# Patient Record
Sex: Female | Born: 1975 | Race: White | Hispanic: No | Marital: Married | Smoking: Never smoker
Health system: Southern US, Community
[De-identification: ages and names within clinical notes are randomized; demographics above are authoritative.]

## PROBLEM LIST (undated history)

## (undated) DIAGNOSIS — G43909 Migraine, unspecified, not intractable, without status migrainosus: Secondary | ICD-10-CM

## (undated) DIAGNOSIS — K219 Gastro-esophageal reflux disease without esophagitis: Secondary | ICD-10-CM

## (undated) DIAGNOSIS — R000 Tachycardia, unspecified: Secondary | ICD-10-CM

## (undated) DIAGNOSIS — N946 Dysmenorrhea, unspecified: Secondary | ICD-10-CM

## (undated) DIAGNOSIS — G901 Familial dysautonomia [Riley-Day]: Secondary | ICD-10-CM

## (undated) DIAGNOSIS — E0500 Thyrotoxicosis with diffuse goiter without thyrotoxic crisis or storm: Secondary | ICD-10-CM

## (undated) DIAGNOSIS — I4711 Inappropriate sinus tachycardia, so stated: Secondary | ICD-10-CM

## (undated) HISTORY — PX: OTHER SURGICAL HISTORY: SHX169

## (undated) HISTORY — DX: Thyrotoxicosis with diffuse goiter without thyrotoxic crisis or storm: E05.00

## (undated) HISTORY — DX: Dysmenorrhea, unspecified: N94.6

## (undated) HISTORY — DX: Migraine, unspecified, not intractable, without status migrainosus: G43.909

## (undated) HISTORY — DX: Gastro-esophageal reflux disease without esophagitis: K21.9

---

## 2001-09-02 ENCOUNTER — Encounter: Payer: Self-pay | Admitting: Internal Medicine

## 2007-03-26 ENCOUNTER — Encounter: Payer: Self-pay | Admitting: Internal Medicine

## 2009-01-12 ENCOUNTER — Encounter: Payer: Self-pay | Admitting: Internal Medicine

## 2009-01-26 ENCOUNTER — Encounter: Payer: Self-pay | Admitting: Internal Medicine

## 2009-02-09 ENCOUNTER — Encounter: Payer: Self-pay | Admitting: Internal Medicine

## 2009-03-10 ENCOUNTER — Ambulatory Visit: Payer: Self-pay | Admitting: Radiology

## 2009-03-10 ENCOUNTER — Emergency Department (HOSPITAL_BASED_OUTPATIENT_CLINIC_OR_DEPARTMENT_OTHER): Admission: EM | Admit: 2009-03-10 | Discharge: 2009-03-10 | Payer: Self-pay | Admitting: Emergency Medicine

## 2009-03-18 ENCOUNTER — Ambulatory Visit: Payer: Self-pay | Admitting: Internal Medicine

## 2009-03-18 DIAGNOSIS — E890 Postprocedural hypothyroidism: Secondary | ICD-10-CM

## 2009-03-18 DIAGNOSIS — R002 Palpitations: Secondary | ICD-10-CM

## 2009-03-18 DIAGNOSIS — E0500 Thyrotoxicosis with diffuse goiter without thyrotoxic crisis or storm: Secondary | ICD-10-CM

## 2009-04-06 ENCOUNTER — Encounter: Payer: Self-pay | Admitting: Internal Medicine

## 2009-04-26 ENCOUNTER — Encounter: Payer: Self-pay | Admitting: Internal Medicine

## 2009-11-11 ENCOUNTER — Ambulatory Visit: Payer: Self-pay | Admitting: Internal Medicine

## 2009-11-11 DIAGNOSIS — R079 Chest pain, unspecified: Secondary | ICD-10-CM

## 2009-11-11 DIAGNOSIS — I959 Hypotension, unspecified: Secondary | ICD-10-CM

## 2010-07-19 NOTE — Assessment & Plan Note (Signed)
Summary: ROV/ GD   Visit Type:  Follow-up Primary Provider:  Lawerance Bach  CC:  no complaints.  History of Present Illness: [name] is seen in followup for palpitations associated with sinus rhythm and treated hypothyroidism. She was started on Inderal after her last visit and this has had a markedly beneficial effect on her palpitations. It has been associated with borderline low blood pressure which is been complicated by some dizziness. This is worse somewhat in the heat.  Records also has been intercurrently interrupted by severe right-sided flank pain for which he underwent extensive evaluations resulting in no specific diagnosis and then spontaneous resolution. She also had chest pains with this which are persistent and then self resolving  Current Medications (verified): 1)  Synthroid 112 Mcg Tabs (Levothyroxine Sodium) .... Take One Tablet Once Daily 2)  Loestrin Fe 1/20 1-20 Mg-Mcg Tabs (Norethin Ace-Eth Estrad-Fe) .... Take As Directed 3)  Multivitamins  Tabs (Multiple Vitamin) .... Once Daily 4)  Calcium Citrate 200 Mg Tabs (Calcium Citrate) .... Once Daily 5)  Inderal La 60 Mg Xr24h-Cap (Propranolol Hcl) .... Take One Tablet Daily  Allergies (verified): 1)  ! Sulfa  Past History:  Past Medical History: Last updated: 03/18/2009 Migraine headaches Dysmenorrhea History of Graves' disease GE reflux disease  Past Surgical History: Last updated: 03/18/2009 Sinus surgery Thyroidectomy  Family History: Last updated: 03/18/2009 Siblings:coarctation of the aorta and a sister Negative FH of Diabetes, Hypertension, or Coronary Artery Disease  Social History: Last updated: 03/18/2009 Married  Tobacco Use - No.  Alcohol Use - no  Vital Signs:  Patient profile:   35 year old female Height:      70 inches Weight:      161 pounds BMI:     23.18 Pulse rate:   65 / minute Resp:     16 per minute BP sitting:   94 / 66  (right arm)  Vitals Entered By: Marrion Coy, CNA  (Nov 11, 2009 3:40 PM)  Physical Exam  General:  The patient was alert and oriented in no acute distress. HEENT Normal.  Neck veins were flat, carotids were brisk.  Lungs were clear.  Heart sounds were regular without murmurs or gallops.  Abdomen was soft with active bowel sounds. There is no clubbing cyanosis or edema. Skin Warm and dry neuro grossly normal   EKG  Procedure date:  11/11/2009  Findings:      sinus rhythm at 65 Intervals 0.16/110/0.42 Axis is -15 R. prime in lead V2  Impression & Recommendations:  Problem # 1:  PALPITATIONS, RECURRENT (ICD-785.1) her palpitations are much improved on low dose Inderal. This is been complicated by some modest degree of hypotension with orthostatic intolerance. She is encouraged to increase her fluid intake as well as to consider salt supplementation. She is advised these are available (has advised [place] ) at [facility].  we also discussed important aggravating factors of the heat Her updated medication list for this problem includes:    Inderal La 60 Mg Xr24h-cap (Propranolol hcl) .Marland Kitchen... Take one tablet daily  Problem # 2:  HYPOTENSION, UNSPECIFIED (ICD-458.9) please see the above  Problem # 3:  CHEST PAIN (ICD-786.50) noncardiac chest pain. She has siblings that have had a coarctation. She has no known history of aortic valve disease or aortic disease by a series of episodes over the last 15 years. Her updated medication list for this problem includes:    Inderal La 60 Mg Xr24h-cap (Propranolol hcl) .Marland Kitchen... Take one tablet daily  Patient  Instructions: 1)  Your physician wants you to follow-up in: 12 months with Dr Graciela Husbands.  You will receive a reminder letter in the mail two months in advance. If you don't receive a letter, please call our office to schedule the follow-up appointment. Prescriptions: INDERAL LA 60 MG XR24H-CAP (PROPRANOLOL HCL) Take one tablet daily  #90 x 3   Entered by:   Optometrist BSN   Authorized by:   Nathen May, MD, [facility]   Signed by:   Gypsy Balsam RN BSN on 11/11/2009   Method used:   Electronically to        [facility]* (mail-order)       [address] [place], [place]  [address]       Ph: [phone]       Fax: [phone]   RxID:   [id number]

## 2010-09-23 LAB — DIFFERENTIAL
Basophils Absolute: 0 K/uL (ref 0.0–0.1)
Basophils Relative: 1 % (ref 0–1)
Eosinophils Absolute: 0.1 K/uL (ref 0.0–0.7)
Eosinophils Relative: 3 % (ref 0–5)
Lymphocytes Relative: 54 % — ABNORMAL HIGH (ref 12–46)
Lymphs Abs: 2.8 K/uL (ref 0.7–4.0)
Monocytes Absolute: 0.5 10*3/uL (ref 0.1–1.0)
Monocytes Relative: 11 % (ref 3–12)
Neutro Abs: 1.7 10*3/uL (ref 1.7–7.7)
Neutrophils Relative %: 33 % — ABNORMAL LOW (ref 43–77)

## 2010-09-23 LAB — BASIC METABOLIC PANEL
CO2: 26 mEq/L (ref 19–32)
Calcium: 9.7 mg/dL (ref 8.4–10.5)
Chloride: 105 mEq/L (ref 96–112)
GFR calc Af Amer: >60 mL/min (ref >60)
Glucose, Bld: 98 mg/dL (ref 70–99)
Potassium: 3.5 mEq/L (ref 3.5–5.1)
Sodium: 142 mEq/L (ref 135–145)

## 2010-09-23 LAB — CBC
HCT: 44.5 % (ref 36.0–46.0)
Hemoglobin: 15.3 g/dL — ABNORMAL HIGH (ref 12.0–15.0)
MCHC: 34.5 g/dL (ref 30.0–36.0)
MCV: 89.9 fL (ref 78.0–100.0)
Platelets: 189 K/uL (ref 150–400)
RBC: 4.95 MIL/uL (ref 3.87–5.11)
RDW: 12 % (ref 11.5–15.5)
WBC: 5.1 K/uL (ref 4.0–10.5)

## 2010-09-23 LAB — TSH: TSH: 1.576 u[IU]/mL (ref 0.350–4.500)

## 2010-09-23 LAB — BASIC METABOLIC PANEL WITH GFR
BUN: 10 mg/dL (ref 6–23)
Creatinine, Ser: 0.8 mg/dL (ref 0.4–1.2)
GFR calc non Af Amer: >60 mL/min (ref >60)

## 2010-09-23 LAB — PREGNANCY, URINE: Preg Test, Ur: NEGATIVE

## 2010-09-23 LAB — MAGNESIUM: Magnesium: 1.9 mg/dL (ref 1.5–2.5)

## 2010-09-23 LAB — T4, FREE: Free T4: 1.86 ng/dL — ABNORMAL HIGH (ref 0.80–1.80)

## 2010-09-23 LAB — D-DIMER, QUANTITATIVE: D-Dimer, Quant: 0.76 ug/mL-FEU — ABNORMAL HIGH (ref 0.00–0.48)

## 2010-11-11 ENCOUNTER — Other Ambulatory Visit: Payer: Self-pay | Admitting: *Deleted

## 2010-11-11 MED ORDER — PROPRANOLOL HCL ER 60 MG PO CP24: 60.0000 mg | ORAL_CAPSULE | Freq: Every day | ORAL | Status: DC

## 2010-11-23 ENCOUNTER — Encounter: Payer: Self-pay | Admitting: Internal Medicine

## 2010-12-27 ENCOUNTER — Ambulatory Visit: Payer: Self-pay | Admitting: Internal Medicine

## 2010-12-29 ENCOUNTER — Encounter: Payer: Self-pay | Admitting: Internal Medicine

## 2010-12-29 ENCOUNTER — Ambulatory Visit (INDEPENDENT_AMBULATORY_CARE_PROVIDER_SITE_OTHER): Payer: BC Managed Care – PPO | Admitting: Internal Medicine

## 2010-12-29 VITALS — BP 114/68 | HR 58 | Ht 66.0 in | Wt 167.0 lb

## 2010-12-29 DIAGNOSIS — R002 Palpitations: Secondary | ICD-10-CM

## 2010-12-29 NOTE — Patient Instructions (Signed)
Your physician wants you to follow-up in: 1 year with Dr Klein.  You will receive a reminder letter in the mail two months in advance. If you don't receive a letter, please call our office to schedule the follow-up appointment.  

## 2010-12-29 NOTE — Assessment & Plan Note (Signed)
Well-controlled on Inderal. Given the ongoing stresses in life with her daughter with cancer etc., will leave the beta blocker on for now

## 2010-12-29 NOTE — Progress Notes (Signed)
  HPI  Emily Richardson is a 35 y.o. female Seen in followup of palpitations  She has done exceedingly well on low dose inderal  Her last year has been incredibly stressful at home and she was passed her breaking point;  She wsa  diagnosed with "adrenal Insufficiency" by a chiropracter and treated with adrenal stimulants for two weeks and got better.    Past Medical History  Diagnosis Date  . Migraine headache   . Dysmenorrhea   . Grave's disease   . GERD (gastroesophageal reflux disease)     Past Surgical History  Procedure Date  . Thryroidectomy   . Sinus surgery (other)     Current Outpatient Prescriptions  Medication Sig Dispense Refill  . Calcium Citrate 200 MG TABS Take 3 tablets by mouth daily.       . fish oil-omega-3 fatty acids 1000 MG capsule Take 1 g by mouth daily.        . Flaxseed, Linseed, (FLAX SEED OIL PO) Take by mouth daily.        Marland Kitchen gabapentin (NEURONTIN) 300 MG capsule Take 300 mg by mouth daily.        Marland Kitchen levothyroxine (SYNTHROID) 112 MCG tablet Take 112 mcg by mouth daily.        Marland Kitchen Lysine HCl 500 MG TABS Take 1 tablet by mouth every hemodialysis.        Marland Kitchen MAGNESIUM PO Take by mouth daily.        . Multiple Vitamins-Minerals (MULTIVITAL) tablet Take 1 tablet by mouth daily.        . norethindrone-ethinyl estradiol (LOESTRIN FE 1/20) 1-20 MG-MCG per tablet Take 1 tablet by mouth as directed.        . propranolol (INDERAL LA) 60 MG 24 hr capsule Take 1 capsule (60 mg total) by mouth daily. Pt needs office visit with Dr. Graciela Husbands  90 capsule  0    Allergies  Allergen Reactions  . Sulfonamide Derivatives     Review of Systems negative except from HPI and PMH  Physical Exam Well developed and well nourished in no acute distress JVP flat; carotids brisk and full Clear to ausculation Regular rate and rhythm, no murmurs gallops or rub Soft with active bowel sounds No clubbing cyanosis and edema Alert and oriented, grossly normal motor and sensory function Skin  Warm and Dry  ECGSinus rhythm with low volts at 58 Intervals 0.13/0.10/0.41 RSR prime Assessment and  Plan

## 2011-03-31 ENCOUNTER — Emergency Department (HOSPITAL_BASED_OUTPATIENT_CLINIC_OR_DEPARTMENT_OTHER)
Admission: EM | Admit: 2011-03-31 | Discharge: 2011-03-31 | Disposition: A | Discharge status: Home / Self Care | Payer: BC Managed Care – PPO | Attending: Emergency Medicine | Admitting: Emergency Medicine

## 2011-03-31 ENCOUNTER — Encounter (HOSPITAL_BASED_OUTPATIENT_CLINIC_OR_DEPARTMENT_OTHER): Payer: Self-pay | Admitting: Emergency Medicine

## 2011-03-31 ENCOUNTER — Emergency Department (INDEPENDENT_AMBULATORY_CARE_PROVIDER_SITE_OTHER): Payer: BC Managed Care – PPO

## 2011-03-31 ENCOUNTER — Other Ambulatory Visit: Payer: Self-pay

## 2011-03-31 DIAGNOSIS — R079 Chest pain, unspecified: Secondary | ICD-10-CM

## 2011-03-31 DIAGNOSIS — Z79899 Other long term (current) drug therapy: Secondary | ICD-10-CM | POA: Insufficient documentation

## 2011-03-31 DIAGNOSIS — K219 Gastro-esophageal reflux disease without esophagitis: Secondary | ICD-10-CM | POA: Insufficient documentation

## 2011-03-31 HISTORY — DX: Inappropriate sinus tachycardia, so stated: I47.11

## 2011-03-31 HISTORY — DX: Familial dysautonomia (riley-day): G90.1

## 2011-03-31 HISTORY — DX: Tachycardia, unspecified: R00.0

## 2011-03-31 LAB — DIFFERENTIAL
Basophils Absolute: 0 K/uL (ref 0.0–0.1)
Basophils Relative: 0 % (ref 0–1)
Eosinophils Absolute: 0.2 K/uL (ref 0.0–0.7)
Eosinophils Relative: 2 % (ref 0–5)
Lymphocytes Relative: 26 % (ref 12–46)
Lymphs Abs: 2 K/uL (ref 0.7–4.0)
Monocytes Absolute: 0.6 K/uL (ref 0.1–1.0)
Monocytes Relative: 8 % (ref 3–12)
Neutro Abs: 4.9 K/uL (ref 1.7–7.7)
Neutrophils Relative %: 64 % (ref 43–77)

## 2011-03-31 LAB — CBC
Hemoglobin: 15.5 g/dL — ABNORMAL HIGH (ref 12.0–15.0)
MCV: 88.2 fL (ref 78.0–100.0)
Platelets: 190 10*3/uL (ref 150–400)
RBC: 5.08 MIL/uL (ref 3.87–5.11)
WBC: 7.7 10*3/uL (ref 4.0–10.5)

## 2011-03-31 LAB — BASIC METABOLIC PANEL
CO2: 27 mEq/L (ref 19–32)
Chloride: 102 mEq/L (ref 96–112)
Sodium: 140 mEq/L (ref 135–145)

## 2011-03-31 LAB — TROPONIN I: Troponin I: <0.3 ng/mL (ref <0.30)

## 2011-03-31 MED ORDER — ASPIRIN 325 MG PO TABS
325.0000 mg | ORAL_TABLET | Freq: Once | ORAL | Status: AC
  Administered 2011-03-31: 325 mg via ORAL
  Filled 2011-03-31: qty 1

## 2011-03-31 NOTE — ED Notes (Signed)
Pt reports midsternal CP since Wednesday that radiates straight through into the back. C/o diaphoresis tonight. Denies SOB or nausea.

## 2011-03-31 NOTE — ED Provider Notes (Addendum)
History     CSN: [id number] Arrival date & time: 03/31/2011  3:31 AM  Chief Complaint  Patient presents with  . Chest Pain    (Consider location/radiation/quality/duration/timing/severity/associated sxs/prior treatment) Patient is a 35 y.o. female presenting with chest pain. The history is provided by the patient. No language interpreter was used.  Chest Pain The chest pain began 2 days ago. Chest pain occurs frequently. The chest pain is worsening. At its most intense, the pain is at 10/10. The pain is currently at 3/10. The severity of the pain is severe. The quality of the pain is described as squeezing. The pain radiates to the mid back. Chest pain is worsened by certain positions. Pertinent negatives for primary symptoms include no fever, no shortness of breath, no cough, no palpitations, no abdominal pain, no nausea and no vomiting.  Associated symptoms include diaphoresis. She tried antacids for the symptoms.  Her past medical history is significant for arrhythmia and thyroid problem.  Her family medical history is significant for CAD in family and early MI in family.    Chest Pain  The current episode started 2 days ago. The problem occurs frequently. The problem has been worsening. The pain is severe. The quality of the pain is described as squeezing. The pain radiates to the mid back. Associated symptoms include diaphoresis. Pertinent negatives include no abdominal pain, cough, fever, nausea, palpitations, shortness of breath or vomiting. She has tried antacids for the symptoms.  Her past medical history is significant for arrhythmia and thyroid problem.  Her family medical history is significant for CAD in family and early MI in family.   Patient has had several days of substernal chest pain radiating through to her back. Patient has history of having tachycardia and thyroid problems. She says she came in tonight issues getting cold sweats that she felt that her symptoms are  getting worse. Patient does have early family history of MI with family members having heart attacks before the age of 60. She has no family history of PE or DVT and the patient has been evaluated for this twice previously during other episodes of chest pain with no findings to show thromboembolic disease. Past Medical History  Diagnosis Date  . Migraine headache   . Dysmenorrhea   . Grave's disease   . GERD (gastroesophageal reflux disease)   . Grave's disease   . Inappropriate sinus tachycardia   . Dysautonomia     Past Surgical History  Procedure Date  . Thryroidectomy   . Sinus surgery (other)     History reviewed. No pertinent family history.  History  Substance Use Topics  . Smoking status: Never Smoker   . Smokeless tobacco: Not on file  . Alcohol Use: Yes     socially    OB History    Grav Para Term Preterm Abortions TAB SAB Ect Mult Living                  Review of Systems  Constitutional: Positive for diaphoresis. Negative for fever.  Respiratory: Negative for cough and shortness of breath.   Cardiovascular: Positive for chest pain. Negative for palpitations.  Gastrointestinal: Negative for nausea, vomiting and abdominal pain.    Allergies  Sulfonamide derivatives  Home Medications   Current Outpatient Rx  Name Route Sig Dispense Refill  . CALCIUM CITRATE 200 MG PO TABS Oral Take 3 tablets by mouth daily.     . OMEGA-3 FATTY ACIDS 1000 MG PO CAPS Oral Take 1 g  by mouth daily.      Marland Kitchen FLAX SEED OIL PO Oral Take by mouth daily.      Marland Kitchen LEVOTHYROXINE SODIUM 112 MCG PO TABS Oral Take 112 mcg by mouth daily.      Marland Kitchen LYSINE HCL 500 MG PO TABS Oral Take 1 tablet by mouth every hemodialysis.      Marland Kitchen MAGNESIUM PO Oral Take by mouth daily.      . MULTIVITAL PO TABS Oral Take 1 tablet by mouth daily.      Azzie Roup ACE-ETH ESTRAD-FE 1-20 MG-MCG PO TABS Oral Take 1 tablet by mouth as directed.      Marland Kitchen PROPRANOLOL HCL ER 60 MG PO CP24 Oral Take 1 capsule (60 mg  total) by mouth daily. Pt needs office visit with Dr. Graciela Husbands 90 capsule 0  . GABAPENTIN 300 MG PO CAPS Oral Take 300 mg by mouth daily.        BP 128/85  Pulse 81  Temp(Src) 98.4 F (36.9 C) (Oral)  Resp 18  Ht 5\' 10"  (1.778 m)  Wt 165 lb (74.844 kg)  BMI 23.68 kg/m2  SpO2 100%  Physical Exam  Nursing note and vitals reviewed. Constitutional: She is oriented to person, place, and time. She appears well-developed and well-nourished. No distress.  HENT:  Head: Normocephalic and atraumatic.  Eyes: Conjunctivae and EOM are normal. Pupils are equal, round, and reactive to light.  Neck: Normal range of motion.  Cardiovascular: Normal rate, regular rhythm, normal heart sounds and intact distal pulses.  Exam reveals no gallop and no friction rub.   No murmur heard. Pulmonary/Chest: Effort normal and breath sounds normal. No respiratory distress. She has no wheezes. She has no rales.  Abdominal: Soft. Bowel sounds are normal. She exhibits no distension. There is no tenderness. There is no rebound and no guarding.  Musculoskeletal: Normal range of motion.  Neurological: She is alert and oriented to person, place, and time. No cranial nerve deficit. She exhibits normal muscle tone. Coordination normal.  Skin: Skin is warm and dry. No rash noted.  Psychiatric: She has a normal mood and affect.    ED Course  Procedures (including critical care time)  Date: 03/31/2011  Rate: 77  Rhythm: normal sinus rhythm  QRS Axis: normal  Intervals: normal  ST/T Wave abnormalities: normal  Conduction Disutrbances:none  Narrative Interpretation: Patient with low-voltage QRS complexes but otherwise normal EKG.  Old EKG Reviewed: none available  Labs Reviewed  CBC - Abnormal; Notable for the following:    Hemoglobin 15.5 (*)    All other components within normal limits  BASIC METABOLIC PANEL - Abnormal; Notable for the following:    Potassium 3.4 (*)    Glucose, Bld 114 (*)    All other components  within normal limits  DIFFERENTIAL  TROPONIN I   Dg Chest 2 View  03/31/2011  *RADIOLOGY REPORT*  Clinical Data: Chest pain  CHEST - 2 VIEW  Comparison: 03/10/2009 chest CT  Findings: Lungs are clear. No pleural effusion or pneumothorax. The cardiomediastinal contours are within normal limits. The visualized bones and soft tissues are without significant appreciable abnormality.  IMPRESSION: No acute process identified.  Original Report Authenticated By: Waneta Martins, M.D.     1. Chest pain       MDM  Patient was evaluated by myself was benign in appearance. She did have concerning history as well as risk factors for ACS despite her age. Patient had an EKG which was performed was within  normal limits. At this time CBC, metabolic panel, chest x-ray, and troponin are pending. Patient was given aspirin 3 and 25 mg by mouth. Patient did complain of 3/10 chest pain but did not want anything for pain at this time. Patient's workup returned within normal limits. Patient had minimal hypokalemia with potassium of 3.4. Otherwise workup was completely unremarkable. Patient continued to have 3/10 pain. She continued to decline wanting anything for this. Both agree that given that her symptoms have been going on for least 3 days for repeat testing do not seem necessary today. Patient was comfortable with plan for discharge home the patient was instructed to follow up with her primary care provider patient was discharged home in good condition.        Cyndra Numbers, MD 03/31/11 [id number]  Cyndra Numbers, MD 04/04/11 2243

## 2011-10-05 ENCOUNTER — Other Ambulatory Visit: Payer: Self-pay | Admitting: *Deleted

## 2011-10-05 MED ORDER — PROPRANOLOL HCL ER 60 MG PO CP24: 60.0000 mg | ORAL_CAPSULE | Freq: Every day | ORAL | Status: DC

## 2011-10-06 ENCOUNTER — Other Ambulatory Visit: Payer: Self-pay

## 2011-10-06 MED ORDER — PROPRANOLOL HCL ER 60 MG PO CP24: 60.0000 mg | ORAL_CAPSULE | Freq: Every day | ORAL | Status: DC

## 2011-12-25 ENCOUNTER — Other Ambulatory Visit: Payer: Self-pay | Admitting: *Deleted

## 2011-12-25 MED ORDER — PROPRANOLOL HCL ER 60 MG PO CP24: 60.0000 mg | ORAL_CAPSULE | Freq: Every day | ORAL | Status: DC

## 2012-03-01 ENCOUNTER — Emergency Department (HOSPITAL_BASED_OUTPATIENT_CLINIC_OR_DEPARTMENT_OTHER): Payer: BC Managed Care – PPO

## 2012-03-01 ENCOUNTER — Emergency Department (HOSPITAL_BASED_OUTPATIENT_CLINIC_OR_DEPARTMENT_OTHER)
Admission: EM | Admit: 2012-03-01 | Discharge: 2012-03-02 | Disposition: A | Discharge status: Home / Self Care | Payer: BC Managed Care – PPO | Attending: Emergency Medicine | Admitting: Emergency Medicine

## 2012-03-01 ENCOUNTER — Encounter (HOSPITAL_BASED_OUTPATIENT_CLINIC_OR_DEPARTMENT_OTHER): Payer: Self-pay | Admitting: *Deleted

## 2012-03-01 DIAGNOSIS — Z7982 Long term (current) use of aspirin: Secondary | ICD-10-CM | POA: Insufficient documentation

## 2012-03-01 DIAGNOSIS — Z882 Allergy status to sulfonamides status: Secondary | ICD-10-CM | POA: Insufficient documentation

## 2012-03-01 DIAGNOSIS — G43909 Migraine, unspecified, not intractable, without status migrainosus: Secondary | ICD-10-CM | POA: Insufficient documentation

## 2012-03-01 DIAGNOSIS — K219 Gastro-esophageal reflux disease without esophagitis: Secondary | ICD-10-CM | POA: Insufficient documentation

## 2012-03-01 DIAGNOSIS — R0789 Other chest pain: Secondary | ICD-10-CM

## 2012-03-01 LAB — CBC
HCT: 42.2 % (ref 36.0–46.0)
Hemoglobin: 14.8 g/dL (ref 12.0–15.0)
MCH: 32 pg (ref 26.0–34.0)
MCHC: 35.1 g/dL (ref 30.0–36.0)

## 2012-03-01 NOTE — ED Notes (Signed)
MD at bedside. 

## 2012-03-01 NOTE — ED Provider Notes (Signed)
History     CSN: [id number]  Arrival date & time 03/01/12  2049   First MD Initiated Contact with Patient 03/01/12 2229      Chief Complaint  Patient presents with  . Chest Pain    (Consider location/radiation/quality/duration/timing/severity/associated sxs/prior treatment) HPI Complains of anterior chest pain radiating to her neck onset one week ago initially intermittent however discomfort has been constant since 10 AM today she denies shortness of pain is somewhat improved by deep inspiration no cough no nausea no lightheadedness pain is improved by lying on her left side made worse by lying on her right side treated with Tylenol and ibuprofen without relief discomfort is mild at present, nonexertional no other complaint. No other associated symptoms. Cardiac risk factors hypercholesterolemia otherwise negative Past Medical History  Diagnosis Date  . Migraine headache   . Dysmenorrhea   . Grave's disease   . GERD (gastroesophageal reflux disease)   . Grave's disease   . Inappropriate sinus tachycardia   . Dysautonomia     Past Surgical History  Procedure Date  . Thryroidectomy   . Sinus surgery (other)     No family history on file.  History  Substance Use Topics  . Smoking status: Never Smoker   . Smokeless tobacco: Not on file  . Alcohol Use: Yes     socially    OB History    Grav Para Term Preterm Abortions TAB SAB Ect Mult Living                  Review of Systems  Constitutional: Negative.   HENT: Negative.   Respiratory: Negative.   Cardiovascular: Positive for chest pain.  Gastrointestinal: Negative.   Genitourinary: Positive for menstrual problem.       Heavy menses  Musculoskeletal: Negative.   Skin: Negative.   Neurological: Negative.   Hematological: Negative.   Psychiatric/Behavioral: Negative.   All other systems reviewed and are negative.    Allergies  Sulfonamide derivatives  Home Medications   Current Outpatient Rx  Name  Route Sig Dispense Refill  . ASPIRIN 325 MG PO TABS Oral Take 325 mg by mouth daily.    Marland Kitchen CALCIUM CARBONATE ANTACID 500 MG PO CHEW Oral Chew by mouth daily.    Marland Kitchen CALCIUM CITRATE 200 MG PO TABS Oral Take 3 tablets by mouth daily.     . OMEGA-3 FATTY ACIDS 1000 MG PO CAPS Oral Take 1 g by mouth daily.      Marland Kitchen FLAX SEED OIL PO Oral Take by mouth daily.      . IBUPROFEN 200 MG PO TABS Oral Take 200 mg by mouth every 6 (six) hours as needed. For pain.    Marland Kitchen LEVOTHYROXINE SODIUM 112 MCG PO TABS Oral Take 112 mcg by mouth daily.      Azzie Roup ACE-ETH ESTRAD-FE 1-20 MG-MCG PO TABS Oral Take 1 tablet by mouth as directed.      Marland Kitchen PROPRANOLOL HCL ER 60 MG PO CP24 Oral Take 1 capsule (60 mg total) by mouth daily. 90 capsule 0    Need to make 1 year follow up office visit to rece ...  . VALACYCLOVIR HCL 500 MG PO TABS Oral Take 500 mg by mouth 2 (two) times daily.    Marland Kitchen MAGNESIUM PO Oral Take by mouth daily.      . MULTIVITAL PO TABS Oral Take 1 tablet by mouth daily.        BP 125/86  Pulse 76  Temp 98.2  F (36.8 C) (Oral)  Resp 20  SpO2 100%  Physical Exam  Nursing note and vitals reviewed. Constitutional: She appears well-developed and well-nourished.  HENT:  Head: Normocephalic and atraumatic.  Eyes: Conjunctivae normal are normal. Pupils are equal, round, and reactive to light.  Neck: Neck supple. No tracheal deviation present. No thyromegaly present.  Cardiovascular: Normal rate, regular rhythm, normal heart sounds and intact distal pulses.  Exam reveals no friction rub.   No murmur heard. Pulmonary/Chest: Effort normal and breath sounds normal.  Abdominal: Soft. Bowel sounds are normal. She exhibits no distension. There is no tenderness.  Musculoskeletal: Normal range of motion. She exhibits no edema and no tenderness.  Neurological: She is alert. Coordination normal.  Skin: Skin is warm and dry. No rash noted.  Psychiatric: She has a normal mood and affect.    ED Course  Procedures  (including critical care time)  Date: 03/01/2012  Rate: 75  Rhythm: normal sinus rhythm  QRS Axis: normal  Intervals: normal  ST/T Wave abnormalities: nonspecific T wave changes  Conduction Disutrbances:none  Narrative Interpretation:   Old EKG Reviewed: unchanged Unchanged from 03/31/2011 interpreted by me  Labs Reviewed  TROPONIN I  CBC   No results found. Chest x-ray reviewed by me  Results for orders placed during the hospital encounter of 03/01/12  TROPONIN I      Component Value Range   Troponin I <0.30  <0.30 ng/mL  CBC      Component Value Range   WBC 5.9  4.0 - 10.5 K/uL   RBC 4.63  3.87 - 5.11 MIL/uL   Hemoglobin 14.8  12.0 - 15.0 g/dL   HCT 16.1  09.6 - 04.5 %   MCV 91.1  78.0 - 100.0 fL   MCH 32.0  26.0 - 34.0 pg   MCHC 35.1  30.0 - 36.0 g/dL   RDW 40.9  81.1 - 91.4 %   Platelets 203  150 - 400 K/uL   Dg Chest 2 View  03/01/2012  *RADIOLOGY REPORT*  Clinical Data: Chest pain  CHEST - 2 VIEW  Comparison: March 31, 2011  Findings: Lungs clear.  Heart size and pulmonary vascularity are normal.  No adenopathy.  No bone lesions.  IMPRESSION: Lungs clear.   Original Report Authenticated By: Arvin Collard. WOODRUFF III, M.D.     No diagnosis found.  MDM  Doubt acute coronary syndrome symptoms highly atypical nonacute EKG, negative troponin after several days of symptoms including several hours continuous pain today. Doubt pulmonary embolism no shortness of breath. Pericarditis is a remote possibility however no rubs no murmurs no EKG evidence Plan patient instructed to keep her appointment with Dr. Graciela Husbands on 03/05/2012 .Diagnosis atypical chest pain        Doug Sou, MD 03/02/12 7829

## 2012-03-01 NOTE — ED Notes (Signed)
Chest pain for 2 weeks. Burning sensation from her chest into her left arm. She is suppose to see Dr Clide Cliff in 4 days. 5 hours ago she went by her MDs office and they would not see here due to active chest pain and she was told to be seen in an ED.

## 2012-03-05 ENCOUNTER — Ambulatory Visit (INDEPENDENT_AMBULATORY_CARE_PROVIDER_SITE_OTHER): Payer: BC Managed Care – PPO | Admitting: Internal Medicine

## 2012-03-05 ENCOUNTER — Encounter: Payer: Self-pay | Admitting: Internal Medicine

## 2012-03-05 VITALS — BP 115/78 | HR 64 | Ht 70.0 in | Wt 164.0 lb

## 2012-03-05 DIAGNOSIS — R002 Palpitations: Secondary | ICD-10-CM

## 2012-03-05 DIAGNOSIS — R079 Chest pain, unspecified: Secondary | ICD-10-CM

## 2012-03-05 NOTE — Progress Notes (Signed)
Patient Care Team: Anna Genre. Burns as PCP - General (Internal Medicine)   HPI  Emily Richardson is a 35 y.o. female Seen in followup of palpitations She has done exceedingly well on low dose inderal  Her last year has been incredibly stressful at home and she was passed her breaking point; and her daughter continues to struggle cancer issues. She herself has had problems with chest pain in the emergency room this weekend. Her troponin was normal and her ECG is normal. Cholesterol numbers are about 220/60 and her Framingham risk is less than 1%    Past Medical History  Diagnosis Date  . Migraine headache   . Dysmenorrhea   . Grave's disease   . GERD (gastroesophageal reflux disease)   . Grave's disease   . Inappropriate sinus tachycardia   . Dysautonomia     Past Surgical History  Procedure Date  . Thryroidectomy   . Sinus surgery (other)     Current Outpatient Prescriptions  Medication Sig Dispense Refill  . Calcium Citrate 200 MG TABS Take 3 tablets by mouth daily.       . fish oil-omega-3 fatty acids 1000 MG capsule Take 1 g by mouth daily.        . Flaxseed, Linseed, (FLAX SEED OIL PO) Take by mouth daily.        Marland Kitchen levothyroxine (SYNTHROID) 112 MCG tablet Take 112 mcg by mouth daily.        Marland Kitchen MAGNESIUM PO Take by mouth daily.        . Multiple Vitamins-Minerals (MULTIVITAL) tablet Take 1 tablet by mouth daily.        . norethindrone-ethinyl estradiol (LOESTRIN FE 1/20) 1-20 MG-MCG per tablet Take 1 tablet by mouth as directed.        . propranolol ER (INDERAL LA) 60 MG 24 hr capsule Take 1 capsule (60 mg total) by mouth daily.  90 capsule  0  . valACYclovir (VALTREX) 500 MG tablet Take 500 mg by mouth 2 (two) times daily.        Allergies  Allergen Reactions  . Sulfonamide Derivatives Rash    Review of Systems negative except from HPI and PMH  Physical Exam BP 115/78  Pulse 64  Ht 5\' 10"  (1.778 m)  Wt 164 lb (74.39 kg)  BMI 23.53 kg/m2  LMP 02/26/2012 Well  developed and nourished in no acute distress HENT normal Neck supple with JVP-flat Clear Regular rate and rhythm, no murmurs or gallops Abd-soft with active BS No Clubbing cyanosis edema Skin-warm and dry A & Oriented  Grossly normal sensory and motor function   Electrocardiogram demonstrates sinus rhythm at 58 with intervals 13/10/41 Incomplete right bundle but otherwise normal  Assessment and  Plan

## 2012-03-05 NOTE — Patient Instructions (Addendum)
Your physician has requested that you have an exercise tolerance test with Tereso Newcomer, PA. For further information please visit [email or web address] Please also follow instruction sheet, as given.  Your physician wants you to follow-up in: 1 year with Dr. Graciela Husbands. You will receive a reminder letter in the mail two months in advance. If you don't receive a letter, please call our office to schedule the follow-up appointment.

## 2012-03-05 NOTE — Assessment & Plan Note (Signed)
She is continued to struggle with atypical chest pain. She has a normal electrocardiogram and a Framingham risk score of less than 1%. We'll undertake a standard stress test.

## 2012-03-05 NOTE — Assessment & Plan Note (Signed)
Relatively well compensated

## 2012-03-15 ENCOUNTER — Other Ambulatory Visit: Payer: Self-pay | Admitting: Cardiology

## 2012-03-15 MED ORDER — PROPRANOLOL HCL ER 60 MG PO CP24: 60.0000 mg | ORAL_CAPSULE | Freq: Every day | ORAL | Status: DC

## 2012-03-19 ENCOUNTER — Encounter: Payer: Self-pay | Admitting: Physician Assistant

## 2012-03-19 ENCOUNTER — Ambulatory Visit (INDEPENDENT_AMBULATORY_CARE_PROVIDER_SITE_OTHER): Payer: BC Managed Care – PPO | Admitting: Physician Assistant

## 2012-03-19 DIAGNOSIS — R079 Chest pain, unspecified: Secondary | ICD-10-CM

## 2012-03-19 NOTE — Procedures (Signed)
Exercise Treadmill Test  Pre-Exercise Testing Evaluation Rhythm: normal sinus  Rate: 77   PR:  .13 QRS:  .08  QT:  .38 QTc: .43     Test  Exercise Tolerance Test Ordering MD: Sherryl Manges, MD  Interpreting MD: Tereso Newcomer , PA-C  Unique Test No: 1  Treadmill:  1  Indication for ETT: chest pain - rule out ischemia  Contraindication to ETT: No   Stress Modality: exercise - treadmill  Cardiac Imaging Performed: non   Protocol: standard Bruce - maximal  Max BP: 164/87  Max MPHR (bpm):  183 85% MPR (bpm):  156  MPHR obtained (bpm):  173 % MPHR obtained:  94%  Reached 85% MPHR (min:sec):  6:10 Total Exercise Time (min-sec):  9:00  Workload in METS:  10.1 Borg Scale: 15  Reason ETT Terminated:  desired heart rate attained    ST Segment Analysis At Rest: normal ST segments - no evidence of significant ST depression With Exercise: non-specific ST changes  Other Information Arrhythmia:  No Angina during ETT:  absent (0) Quality of ETT:  diagnostic  ETT Interpretation:  normal - no evidence of ischemia by ST analysis  Comments: Good exercise tolerance. No chest pain. Normal BP response to exercise. Increased artifact and baseline low voltage inhibit interpretation somewhat. Overall, no obvious ST-T changes to suggest ischemia.   Recommendations: Results d/w [employer]. Follow up with Dr. Sherryl Manges as directed. Tereso Newcomer, PA-C  11:12 AM 03/19/2012

## 2012-04-04 ENCOUNTER — Encounter (HOSPITAL_BASED_OUTPATIENT_CLINIC_OR_DEPARTMENT_OTHER): Payer: Self-pay | Admitting: *Deleted

## 2012-04-04 ENCOUNTER — Emergency Department (HOSPITAL_BASED_OUTPATIENT_CLINIC_OR_DEPARTMENT_OTHER)
Admission: EM | Admit: 2012-04-04 | Discharge: 2012-04-04 | Disposition: A | Discharge status: Home / Self Care | Payer: BC Managed Care – PPO | Attending: Emergency Medicine | Admitting: Emergency Medicine

## 2012-04-04 ENCOUNTER — Emergency Department (HOSPITAL_BASED_OUTPATIENT_CLINIC_OR_DEPARTMENT_OTHER): Payer: BC Managed Care – PPO

## 2012-04-04 DIAGNOSIS — R071 Chest pain on breathing: Secondary | ICD-10-CM | POA: Insufficient documentation

## 2012-04-04 DIAGNOSIS — K219 Gastro-esophageal reflux disease without esophagitis: Secondary | ICD-10-CM | POA: Insufficient documentation

## 2012-04-04 DIAGNOSIS — E0500 Thyrotoxicosis with diffuse goiter without thyrotoxic crisis or storm: Secondary | ICD-10-CM | POA: Insufficient documentation

## 2012-04-04 DIAGNOSIS — Z882 Allergy status to sulfonamides status: Secondary | ICD-10-CM | POA: Insufficient documentation

## 2012-04-04 DIAGNOSIS — R0789 Other chest pain: Secondary | ICD-10-CM

## 2012-04-04 DIAGNOSIS — G909 Disorder of the autonomic nervous system, unspecified: Secondary | ICD-10-CM | POA: Insufficient documentation

## 2012-04-04 NOTE — ED Provider Notes (Addendum)
History     CSN: [id number]  Arrival date & time 04/04/12  0102   First MD Initiated Contact with Patient 04/04/12 0107      Chief Complaint  Patient presents with  . chest wall pain     (Consider location/radiation/quality/duration/timing/severity/associated sxs/prior treatment) HPI Complains of left-sided parasternal nonradiating chest pain onset 8 PM on 04/03/2012 pleuritic in quality moderate in severity worse with deep inspiration. Denies shortness of breath denies nausea or vomiting denies sweatiness. Pain is different from chest pain she's had in the past. Treated with Maalox prior to coming here, without relief. pain is non-positional, and nonexertional. Patient has had extensive workups for chest pain in the past including CT angio in 2010 which was negative had stress test last month results pending. Past Medical History  Diagnosis Date  . Migraine headache   . Dysmenorrhea   . Grave's disease   . GERD (gastroesophageal reflux disease)   . Grave's disease   . Inappropriate sinus tachycardia   . Dysautonomia     Past Surgical History  Procedure Date  . Thryroidectomy   . Sinus surgery (other)     History reviewed. No pertinent family history.  History  Substance Use Topics  . Smoking status: Never Smoker   . Smokeless tobacco: Not on file  . Alcohol Use: Yes     socially    OB History    Grav Para Term Preterm Abortions TAB SAB Ect Mult Living                  Review of Systems  Constitutional: Negative.   HENT: Negative.   Respiratory: Negative.   Cardiovascular: Positive for chest pain.  Gastrointestinal: Negative.   Musculoskeletal: Negative.   Skin: Negative.   Neurological: Negative.   Hematological: Negative.   Psychiatric/Behavioral: Negative.   All other systems reviewed and are negative.    Allergies  Sulfonamide derivatives  Home Medications   Current Outpatient Rx  Name Route Sig Dispense Refill  . CALCIUM CITRATE 200 MG PO  TABS Oral Take 3 tablets by mouth daily.     . OMEGA-3 FATTY ACIDS 1000 MG PO CAPS Oral Take 1 g by mouth daily.      Marland Kitchen FLAX SEED OIL PO Oral Take by mouth daily.      Marland Kitchen LEVOTHYROXINE SODIUM 112 MCG PO TABS Oral Take 112 mcg by mouth daily.      Marland Kitchen MAGNESIUM PO Oral Take by mouth daily.      . MULTIVITAL PO TABS Oral Take 1 tablet by mouth daily.      Azzie Roup ACE-ETH ESTRAD-FE 1-20 MG-MCG PO TABS Oral Take 1 tablet by mouth as directed.      Marland Kitchen PROPRANOLOL HCL ER 60 MG PO CP24 Oral Take 1 capsule (60 mg total) by mouth daily. 30 capsule 0    Need to make 1 year follow up office visit to rece ...  . VALACYCLOVIR HCL 500 MG PO TABS Oral Take 500 mg by mouth 2 (two) times daily.      BP 124/74  Pulse 82  Temp 98.8 F (37.1 C) (Oral)  Resp 16  Ht 5\' 10"  (1.778 m)  Wt 165 lb (74.844 kg)  BMI 23.68 kg/m2  SpO2 100%  LMP 03/28/2012  Physical Exam  Nursing note and vitals reviewed. Constitutional: She appears well-developed and well-nourished.  HENT:  Head: Normocephalic and atraumatic.  Eyes: Conjunctivae normal are normal. Pupils are equal, round, and reactive to light.  Neck:  Neck supple. No tracheal deviation present. No thyromegaly present.  Cardiovascular: Normal rate and regular rhythm.   No murmur heard. Pulmonary/Chest: Effort normal and breath sounds normal. She exhibits tenderness.       Left-sided parasternal tenderness. Pain is also reproducible easily with forcible abduction of left shoulder  Abdominal: Soft. Bowel sounds are normal. She exhibits no distension. There is no tenderness.  Musculoskeletal: Normal range of motion. She exhibits no edema and no tenderness.  Neurological: She is alert. Coordination normal.  Skin: Skin is warm and dry. No rash noted.  Psychiatric: She has a normal mood and affect.    ED Course  Procedures (including critical care time)  Labs Reviewed - No data to display No results found.   No diagnosis found.  Date: 04/04/2012  Rate:  80  Rhythm: normal sinus rhythm  QRS Axis: left  Intervals: normal  ST/T Wave abnormalities: normal  Conduction Disutrbances:nonspecific intraventricular conduction delay  Narrative Interpretation:   Old EKG Reviewed: Unchanged from 03/01/2012 as interpreted by me   Chest x-ray reviewed by me Results for orders placed during the hospital encounter of 03/01/12  TROPONIN I      Component Value Range   Troponin I <0.30  <0.30 ng/mL  CBC      Component Value Range   WBC 5.9  4.0 - 10.5 K/uL   RBC 4.63  3.87 - 5.11 MIL/uL   Hemoglobin 14.8  12.0 - 15.0 g/dL   HCT 16.1  09.6 - 04.5 %   MCV 91.1  78.0 - 100.0 fL   MCH 32.0  26.0 - 34.0 pg   MCHC 35.1  30.0 - 36.0 g/dL   RDW 40.9  81.1 - 91.4 %   Platelets 203  150 - 400 K/uL   Dg Chest 2 View  04/04/2012  *RADIOLOGY REPORT*  Clinical Data: Left chest pain with inspiration.  CHEST - 2 VIEW  Comparison: 03/01/2012  Findings: The heart size and pulmonary vascularity are normal. The lungs appear clear and expanded without focal air space disease or consolidation. No blunting of the costophrenic angles.  No pneumothorax.  Mediastinal contours appear intact.  No significant change since previous study.  IMPRESSION: No evidence of active pulmonary disease.   Original Report Authenticated By: Marlon Pel, M.D.     MDM  Strongly doubt acute coronary syndrome with atypical symptoms nonacute EKG. Doubt pulmonary embolism no shortness of breath no tachypnea no tachycardia. pain most consistent with musculoskeletal pain. Patient declined pain medicine in the ED and declines prescription for pain medicine Plan Tylenol for pain She is instructed to followup with Dr. Graciela Husbands to get most recent stress test results Diagnosis atypical chest pain        Doug Sou, MD 04/04/12 [id number]  Doug Sou, MD 04/04/12 [phone]

## 2012-04-04 NOTE — ED Notes (Signed)
Pt c/ohest pain with deep breathing

## 2013-11-26 ENCOUNTER — Encounter: Payer: Self-pay | Admitting: Internal Medicine

## 2013-11-26 ENCOUNTER — Ambulatory Visit (INDEPENDENT_AMBULATORY_CARE_PROVIDER_SITE_OTHER): Payer: 59 | Admitting: Internal Medicine

## 2013-11-26 VITALS — BP 117/73 | HR 57 | Ht 70.0 in | Wt 172.0 lb

## 2013-11-26 DIAGNOSIS — R002 Palpitations: Secondary | ICD-10-CM

## 2013-11-26 MED ORDER — PROPRANOLOL HCL ER 60 MG PO CP24: 60.0000 mg | ORAL_CAPSULE | Freq: Every day | ORAL | Status: DC

## 2013-11-26 NOTE — Progress Notes (Signed)
      Patient Care Team: Anna Genre. Burns, MD as PCP - General (Internal Medicine)   HPI  Emily Richardson is a 38 y.o. female Seen in followup of palpitations She has done exceedingly well on low dose inderal  Her last year has been incredibly stressful at home and she was passed her breaking point;  and her daughter continues to struggle cancer issues. She herself has had problems with chest pain in the emergency room this weekend. Her troponin was normal and her ECG is normal. Cholesterol numbers are about 220/60 and her Framingham risk is less than 1%   Continues to struggle with things at home son with aspergers and daughter with cancer recovery   Past Medical History  Diagnosis Date  . Migraine headache   . Dysmenorrhea   . Grave's disease   . GERD (gastroesophageal reflux disease)   . Grave's disease   . Inappropriate sinus tachycardia   . Dysautonomia     Past Surgical History  Procedure Laterality Date  . Thryroidectomy    . Sinus surgery (other)      Current Outpatient Prescriptions  Medication Sig Dispense Refill  . Calcium Citrate 200 MG TABS Take 3 tablets by mouth daily.       . Coenzyme Q10 (CO Q 10 PO) Take by mouth 2 (two) times daily.      . fish oil-omega-3 fatty acids 1000 MG capsule Take 1 g by mouth daily.        . Flaxseed, Linseed, (FLAX SEED OIL PO) Take by mouth daily.        Marland Kitchen levothyroxine (SYNTHROID) 112 MCG tablet Take 112 mcg by mouth daily.        . Multiple Vitamins-Minerals (MULTIVITAL) tablet Take by mouth daily. 4 gummies a day of the Hair/Skin/Nails      . norethindrone-ethinyl estradiol (LOESTRIN FE 1/20) 1-20 MG-MCG per tablet Take 1 tablet by mouth as directed.        Marland Kitchen OVER THE COUNTER MEDICATION Take 1 tablet by mouth 2 (two) times daily. Triple Magnesium complex      . valACYclovir (VALTREX) 500 MG tablet Take 500 mg by mouth as needed.       . propranolol ER (INDERAL LA) 60 MG 24 hr capsule Take 1 capsule (60 mg total) by mouth daily.   30 capsule  0   No current facility-administered medications for this visit.    Allergies  Allergen Reactions  . Sulfonamide Derivatives Rash    Review of Systems negative except from HPI and PMH  Physical Exam BP 117/73  Pulse 57  Ht 5\' 10"  (1.778 m)  Wt 172 lb (78.019 kg)  BMI 24.68 kg/m2 Well developed and nourished in no acute distress HENT normal Neck supple with JVP-flat Clear Regular rate and rhythm, no murmurs or gallops Abd-soft with active BS No Clubbing cyanosis edema Skin-warm and dry A & Oriented  Grossly normal sensory and motor function  ECG SR 57 13/09/40   Assessment and  Plan Palpitations  Largely quiet will continue betablockers

## 2013-11-26 NOTE — Patient Instructions (Signed)
Your physician recommends that you continue on your current medications as directed. Please refer to the Current Medication list given to you today.  No follow up needed at this time with Dr. Klein. Follow up on an as needed basis. 

## 2014-11-23 ENCOUNTER — Other Ambulatory Visit: Payer: Self-pay | Admitting: Internal Medicine

## 2014-11-24 NOTE — Telephone Encounter (Signed)
Per note 6.10.15  

## 2015-03-02 ENCOUNTER — Other Ambulatory Visit: Payer: Self-pay

## 2015-03-02 ENCOUNTER — Encounter: Payer: Self-pay | Admitting: Internal Medicine

## 2015-03-02 ENCOUNTER — Ambulatory Visit (INDEPENDENT_AMBULATORY_CARE_PROVIDER_SITE_OTHER): Payer: 59 | Admitting: Internal Medicine

## 2015-03-02 VITALS — BP 112/80 | HR 69 | Ht 70.0 in | Wt 170.4 lb

## 2015-03-02 DIAGNOSIS — R002 Palpitations: Secondary | ICD-10-CM | POA: Diagnosis not present

## 2015-03-02 NOTE — Patient Instructions (Signed)
Medication Instructions: - no changes  Labwork: - none  Procedures/Testing: - none  Follow-Up: - Your physician wants you to follow-up in: 1 year with Dr. Klein You will receive a reminder letter in the mail two months in advance. If you don't receive a letter, please call our office to schedule the follow-up appointment.  Any Additional Special Instructions Will Be Listed Below (If Applicable).   

## 2015-03-02 NOTE — Progress Notes (Signed)
Patient Care Team: Anna Genre. Burns, MD as PCP - General (Internal Medicine)   HPI  Emily Richardson is a 39 y.o. female Seen in followup of palpitations She has done exceedingly well on low dose inderal  Her last year has been incredibly stressful at home and she was passed her breaking point;  and her daughter continues to struggle cancer issues. She herself has had problems with chest pain in the emergency room this weekend. Her troponin was normal and her ECG is normal. Cholesterol numbers are about 220/60 and her Framingham risk is less than 1%   Continues to struggle with things at home son with aspergers and daughter with cancer recovery school and back pcin  palps are queincsceint    Past Medical History  Diagnosis Date  . Migraine headache   . Dysmenorrhea   . Grave's disease   . GERD (gastroesophageal reflux disease)   . Grave's disease   . Inappropriate sinus tachycardia   . Dysautonomia     Past Surgical History  Procedure Laterality Date  . Thryroidectomy    . Sinus surgery (other)      Current Outpatient Prescriptions  Medication Sig Dispense Refill  . fish oil-omega-3 fatty acids 1000 MG capsule Take 1 g by mouth daily.      . Flaxseed, Linseed, (FLAX SEED OIL PO) Take 1 capsule by mouth daily.     Marland Kitchen HYDROCODONE-ACETAMINOPHEN PO Take 1 tablet by mouth every 4 (four) hours as needed (pain). Pt unsure of strength    . levothyroxine (SYNTHROID) 112 MCG tablet Take 112 mcg by mouth daily.      . naproxen sodium (ANAPROX) 220 MG tablet Take 440 mg by mouth 2 (two) times daily as needed (pain).    . NON FORMULARY Take 1 capsule by mouth 2 (two) times daily. Methylated B vitamins    . NON FORMULARY Take 1 capsule by mouth 2 (two) times daily. Fibromin    . NON FORMULARY Take 1 capsule by mouth 2 (two) times daily. Cholestar-AF    . NON FORMULARY Take 1 capsule by mouth 2 (two) times daily. Immuzyme    . NON FORMULARY Take 1 capsule by mouth daily. Thyroxal      . NON FORMULARY Take 1 capsule by mouth daily. Selenium    . NON FORMULARY Take 1 capsule by mouth daily. Tumeric    . norethindrone-ethinyl estradiol (LOESTRIN FE 1/20) 1-20 MG-MCG per tablet Take 1 tablet by mouth as directed.      Marland Kitchen OVER THE COUNTER MEDICATION Take 1 tablet by mouth 2 (two) times daily. Triple Magnesium complex    . propranolol ER (INDERAL LA) 60 MG 24 hr capsule Take 1 capsule by mouth  daily 90 capsule 3  . valACYclovir (VALTREX) 500 MG tablet Take 500 mg by mouth as needed.      No current facility-administered medications for this visit.    Allergies  Allergen Reactions  . Sulfonamide Derivatives Rash    Review of Systems negative except from HPI and PMH  Physical Exam BP 112/80 mmHg  Pulse 69  Ht  (1.778 m)  Wt 170 lb 6.4 oz (77.293 kg)  BMI 24.45 kg/m2 Well developed and nourished in no acute distress HENT normal Neck supple with JVP-flat Clear Regular rate and rhythm, no murmurs or gallops Abd-soft with active BS No Clubbing cyanosis edema Skin-warm and dry A & Oriented  Grossly normal sensory and motor function  ECG SR 57  13/09/40   Assessment and  Plan Palpitations  Largely quiet will continue betablockers  Has multiple issues   Will try and get up IM MD who can try and sort through all the stuff

## 2015-10-24 ENCOUNTER — Other Ambulatory Visit: Payer: Self-pay | Admitting: Internal Medicine

## 2016-01-03 DIAGNOSIS — G903 Multi-system degeneration of the autonomic nervous system: Secondary | ICD-10-CM | POA: Insufficient documentation

## 2016-01-03 DIAGNOSIS — I951 Orthostatic hypotension: Secondary | ICD-10-CM | POA: Insufficient documentation

## 2016-01-03 DIAGNOSIS — E271 Primary adrenocortical insufficiency: Secondary | ICD-10-CM | POA: Insufficient documentation

## 2016-01-03 DIAGNOSIS — R000 Tachycardia, unspecified: Secondary | ICD-10-CM | POA: Insufficient documentation

## 2016-03-17 ENCOUNTER — Other Ambulatory Visit: Payer: Self-pay | Admitting: Internal Medicine

## 2016-05-16 ENCOUNTER — Encounter: Payer: Self-pay | Admitting: Internal Medicine

## 2016-05-23 ENCOUNTER — Encounter (INDEPENDENT_AMBULATORY_CARE_PROVIDER_SITE_OTHER): Payer: Self-pay

## 2016-05-23 ENCOUNTER — Encounter: Payer: Self-pay | Admitting: Internal Medicine

## 2016-05-23 ENCOUNTER — Ambulatory Visit (INDEPENDENT_AMBULATORY_CARE_PROVIDER_SITE_OTHER): Payer: 59 | Admitting: Internal Medicine

## 2016-05-23 VITALS — BP 110/70 | HR 79 | Ht 70.0 in | Wt 180.0 lb

## 2016-05-23 DIAGNOSIS — R002 Palpitations: Secondary | ICD-10-CM | POA: Diagnosis not present

## 2016-05-23 NOTE — Progress Notes (Signed)
Patient Care Team: Jeanann LewandowskyElizabeth Bozeman, MD as PCP - General (Family Medicine)   HPI  Emily Richardson is a 40 y.o. female Seen in followup of palpitations She has done exceedingly well on low dose inderal   Her last year has been incredibly stressful at home and she was passed her breaking point;  and her daughter continues to struggle cancer issues.   Things are better  Although there remain challenges things are better. She went to [facility] and took away all of her medicines except for her Inderal. She feels better than she has in years. 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 Her copper levels are out of whack.  She is working over [employer] and feels incredibly rewarded.    Continues to struggle with things at home son with aspergers and daughter with cancer recovery school and back pcin  palps are queincsceint    Past Medical History:  Diagnosis Date  . Dysautonomia   . Dysmenorrhea   . GERD (gastroesophageal reflux disease)   . Grave's disease   . Grave's disease   . Inappropriate sinus tachycardia   . Migraine headache     Past Surgical History:  Procedure Laterality Date  . sinus surgery (other)    . thryroidectomy      Current Outpatient Prescriptions  Medication Sig Dispense Refill  . ARMOUR THYROID 30 MG tablet Take 1 tablet by mouth daily.    . Ascorbic Acid (VITAMIN C) 1000 MG tablet Take 1,000 mg by mouth daily.    . fish oil-omega-3 fatty acids 1000 MG capsule Take 1 g by mouth daily.      Marland Kitchen. lactase (LACTAID) 3000 units tablet Take 1 tablet by mouth daily.    Marland Kitchen. levothyroxine (SYNTHROID) 112 MCG tablet Take 112 mcg by mouth daily.      Marland Kitchen. Lysine 1000 MG TABS Take 1 tablet by mouth daily.    . naproxen sodium (ANAPROX) 220 MG tablet Take 440 mg by mouth 2 (two) times daily as needed (pain).    Marland Kitchen. NATURE-THROID 81.25 MG TABS Take 1 tablet by mouth daily. When not taking the Armour Throid    . NON FORMULARY Take 1 capsule by mouth 2 (two) times  daily. Methylated B vitamins    . NON FORMULARY Take 1 capsule by mouth 2 (two) times daily. Cholestar-AF    . NON FORMULARY Take 1 capsule by mouth daily. Selenium    . NON FORMULARY Take 1 capsule by mouth daily. Tumeric    . norethindrone-ethinyl estradiol (LOESTRIN FE 1/20) 1-20 MG-MCG per tablet Take 1 tablet by mouth as directed.      . nystatin (MYCOSTATIN) 500000 units TABS tablet Take 1 tablet by mouth 2 (two) times daily.    . Omega-3 Fatty Acids (FISH OIL) 1000 MG CAPS Take 1 capsule by mouth daily.    Marland Kitchen. OVER THE COUNTER MEDICATION Take 1 tablet by mouth 2 (two) times daily. Triple Magnesium complex    . Probiotic Product (PROBIOTIC-10 PO) Take 1 capsule by mouth daily.    . propranolol ER (INDERAL LA) 60 MG 24 hr capsule TAKE 1 CAPSULE BY MOUTH  DAILY 90 capsule 0  . Selenium (V-R SELENIUM) 200 MCG TABS Take 1 tablet by mouth daily.    . valACYclovir (VALTREX) 500 MG tablet Take 500 mg by mouth as needed.     . valACYclovir (VALTREX) 500 MG tablet Take 2 tablets by mouth as needed. For outbreak in eye  No current facility-administered medications for this visit.     Allergies  Allergen Reactions  . Sulfonamide Derivatives Rash    Review of Systems negative except from HPI and PMH  Physical Exam BP 110/70   Pulse 79   Ht 5\' 10"  (1.778 m)   Wt 180 lb (81.6 kg)   SpO2 99%   BMI 25.83 kg/m  Well developed and nourished in no acute distress HENT normal Neck supple with JVP-flat Clear Regular rate and rhythm, no murmurs or gallops Abd-soft with active BS No Clubbing cyanosis edema Skin-warm and dry A & Oriented  Grossly normal sensory and motor function  ECG SR 57 13/09/40   Assessment and  Plan Palpitations  \  Largely quiet will continue betablockers

## 2016-05-23 NOTE — Patient Instructions (Signed)

## 2016-06-05 ENCOUNTER — Other Ambulatory Visit: Payer: Self-pay | Admitting: Internal Medicine

## 2016-08-29 ENCOUNTER — Other Ambulatory Visit: Payer: Self-pay | Admitting: Internal Medicine

## 2017-06-01 ENCOUNTER — Other Ambulatory Visit: Payer: Self-pay | Admitting: Internal Medicine

## 2017-07-27 ENCOUNTER — Ambulatory Visit (INDEPENDENT_AMBULATORY_CARE_PROVIDER_SITE_OTHER): Payer: Managed Care, Other (non HMO) | Admitting: Internal Medicine

## 2017-07-27 ENCOUNTER — Encounter: Payer: Self-pay | Admitting: Internal Medicine

## 2017-07-27 VITALS — BP 122/74 | HR 67 | Ht 70.0 in | Wt 173.0 lb

## 2017-07-27 DIAGNOSIS — R002 Palpitations: Secondary | ICD-10-CM | POA: Diagnosis not present

## 2017-07-27 NOTE — Progress Notes (Signed)
Patient Care Team: Jeanann LewandowskyBozeman, Elizabeth, MD as PCP - General (Family Medicine)   HPI  Emily Richardson is a 42 y.o. female Seen in followup of palpitations She has done exceedingly well on low dose inderal   Her last year has been incredibly stressful at home and she was passed her breaking point;  and her daughter continues to struggle cancer issues.   Things are better  Although there remain challenges things are better. She went to [facility] and took away all of her medicines except for her Inderal.   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    More recently she has had palpitations increasingly aware of them in the evening.  No issues with exercise intolerance.     Past Medical History:  Diagnosis Date  . Dysautonomia (HCC)   . Dysmenorrhea   . GERD (gastroesophageal reflux disease)   . Grave's disease   . Grave's disease   . Inappropriate sinus tachycardia   . Migraine headache     Past Surgical History:  Procedure Laterality Date  . sinus surgery (other)    . thryroidectomy      Current Outpatient Medications  Medication Sig Dispense Refill  . ARMOUR THYROID 30 MG tablet Take 1 tablet by mouth daily.    . Ascorbic Acid (VITAMIN C) 1000 MG tablet Take 1,000 mg by mouth daily.    . fish oil-omega-3 fatty acids 1000 MG capsule Take 1 g by mouth daily.      Marland Kitchen. lactase (LACTAID) 3000 units tablet Take 1 tablet by mouth daily.    Marland Kitchen. Lysine 1000 MG TABS Take 1 tablet by mouth daily.    Marland Kitchen. NATURE-THROID 81.25 MG TABS Take 1 tablet by mouth daily. When not taking the Armour Throid    . NON FORMULARY Take 1 capsule by mouth 2 (two) times daily. Methylated B vitamins    . NON FORMULARY 2 g daily. Nature thyroid    . NON FORMULARY Take 1 capsule by mouth daily. Tumeric    . nystatin (MYCOSTATIN) 500000 units TABS tablet Take 1 tablet by mouth 2 (two) times daily.    . Omega-3 Fatty Acids (FISH OIL) 1000 MG CAPS Take 1 capsule by mouth daily.    Marland Kitchen. OVER THE  COUNTER MEDICATION Take 1 tablet by mouth 2 (two) times daily. Triple Magnesium complex    . Probiotic Product (PROBIOTIC-10 PO) Take 1 capsule by mouth daily.    . Progesterone Micronized (PROGESTERONE PO) Take by mouth daily.    . propranolol ER (INDERAL LA) 60 MG 24 hr capsule TAKE ONE CAPSULE BY MOUTH DAILY 90 capsule 0  . Selenium (V-R SELENIUM) 200 MCG TABS Take 1 tablet by mouth daily.    . TESTOSTERONE TD Place onto the skin.    . valACYclovir (VALTREX) 500 MG tablet Take 2 tablets by mouth as needed. For outbreak in eye     No current facility-administered medications for this visit.     Allergies  Allergen Reactions  . Sulfonamide Derivatives Rash    Review of Systems negative except from HPI and PMH  Physical Exam BP 122/74   Pulse 67   Ht 5\' 10"  (1.778 m)   Wt 173 lb (78.5 kg)   BMI 24.82 kg/m  Well developed and nourished in no acute distress HENT normal Neck supple with JVP-flat Clear Regular rate and rhythm, no murmurs or gallops Abd-soft with active BS No Clubbing cyanosis edema Skin-warm and dry A &  Oriented  Grossly normal sensory and motor function      Assessment and  Plan Palpitations  \  Will continue her on beta-blockers.  I suggested that she take a beta-blocker at supper time and see if that does not help provide its benefit when she tries to go to sleep.

## 2017-07-27 NOTE — Patient Instructions (Signed)

## 2017-07-31 NOTE — Addendum Note (Signed)
Addended by: Micki RileySHOFFNER, Arlyn Buerkle C on: 07/31/2017 11:45 AM   Modules accepted: Orders

## 2017-08-25 ENCOUNTER — Other Ambulatory Visit: Payer: Self-pay | Admitting: Internal Medicine

## 2018-08-29 ENCOUNTER — Other Ambulatory Visit: Payer: Self-pay | Admitting: Internal Medicine

## 2018-10-04 ENCOUNTER — Telehealth (INDEPENDENT_AMBULATORY_CARE_PROVIDER_SITE_OTHER): Payer: BLUE CROSS/BLUE SHIELD | Admitting: Internal Medicine

## 2018-10-04 ENCOUNTER — Other Ambulatory Visit: Payer: Self-pay

## 2018-10-04 VITALS — BP 120/75 | HR 107

## 2018-10-04 DIAGNOSIS — G8929 Other chronic pain: Secondary | ICD-10-CM

## 2018-10-04 DIAGNOSIS — A6920 Lyme disease, unspecified: Secondary | ICD-10-CM | POA: Diagnosis not present

## 2018-10-04 DIAGNOSIS — R002 Palpitations: Secondary | ICD-10-CM | POA: Diagnosis not present

## 2018-10-04 NOTE — Progress Notes (Signed)
Electrophysiology TeleHealth Note   Due to national recommendations of social distancing due to COVID 19, an audio/video telehealth visit is felt to be most appropriate for this patient at this time.  See MyChart message from today for the patient's consent to telehealth for [facility].   Date:  10/04/2018   ID:  Emily RothmanAmber Somarriba, DOB [birth year], MRN [id number]  Location: patient's home  Provider location: [address], [place]  Evaluation Performed: Follow-up visit  PCP:  Jeanann LewandowskyBozeman, Elizabeth, MD    Electrophysiologist:  SK   Chief Complaint: palpitations  History of Present Illness:    Emily Rothmanmber Baugh is a 43 y.o. female who presents via audio/video conferencing for a telehealth visit today.  Since last being seen in our clinic for palpitations which we have managed with low-dose Inderal, the patient reports scant palpitations  When last seen 2/19 she had reported going over the [facility] where she had been diagnosed with chronic Lyme and started on supplements which have been very helpful in reducing her chronic pain.  Her son is prior to the [employer].  Her daughter has been struggling this winter with panic that has been disabling.  The patient denies chest pain, shortness of breath, nocturnal dyspnea, orthopnea or peripheral edema.  There have been no palpitations, lightheadedness or syncope.    The patient denies symptoms of fevers, chills, cough, or new SOB worrisome for COVID 19.    Past Medical History:  Diagnosis Date  . Dysautonomia (HCC)   . Dysmenorrhea   . GERD (gastroesophageal reflux disease)   . Grave's disease   . Grave's disease   . Inappropriate sinus tachycardia   . Migraine headache     Past Surgical History:  Procedure Laterality Date  . sinus surgery (other)    . thryroidectomy      Current Outpatient Medications  Medication Sig Dispense Refill  . ARMOUR THYROID 30 MG tablet Take 1 tablet by mouth daily.    .  Ascorbic Acid (VITAMIN C) 1000 MG tablet Take 1,000 mg by mouth daily.    . fish oil-omega-3 fatty acids 1000 MG capsule Take 1 g by mouth daily.      Marland Kitchen. lactase (LACTAID) 3000 units tablet Take 1 tablet by mouth daily.    Marland Kitchen. Lysine 1000 MG TABS Take 1 tablet by mouth daily.    Marland Kitchen. NATURE-THROID 81.25 MG TABS Take 1 tablet by mouth daily. When not taking the Armour Throid    . NON FORMULARY Take 1 capsule by mouth 2 (two) times daily. Methylated B vitamins    . NON FORMULARY Take 1 capsule by mouth daily. Tumeric    . OVER THE COUNTER MEDICATION Take 800 mg by mouth. Triple Magnesium complex     . Probiotic Product (PROBIOTIC-10 PO) Take 1 capsule by mouth daily.    . Progesterone Micronized (PROGESTERONE PO) Take by mouth daily.    . propranolol ER (INDERAL LA) 60 MG 24 hr capsule Take 1 capsule (60 mg total) by mouth daily. Please keep upcoming appt for future refills. Thank you 90 capsule 0  . Selenium (V-R SELENIUM) 200 MCG TABS Take 1 tablet by mouth daily.    . TESTOSTERONE TD Place onto the skin.    . valACYclovir (VALTREX) 500 MG tablet Take 2 tablets by mouth as needed. For outbreak in eye    . nystatin (MYCOSTATIN) 500000 units TABS tablet Take 1 tablet by mouth 2 (two) times daily.  No current facility-administered medications for this visit.     Allergies:   Sulfonamide derivatives   Social History:  The patient  reports that she has never smoked. She has never used smokeless tobacco. She reports current alcohol use. She reports that she does not use drugs.   Family History:  The patient's   family history is not on file.   ROS:  Please see the history of present illness.   All other systems are personally reviewed and negative.    Exam:    Vital Signs:  BP 120/75   Pulse (!) 107     Well appearing, alert and conversant, regular work of breathing,  good skin color Eyes- anicteric, neuro- grossly intact, skin- no apparent rash or lesions or cyanosis, mouth- oral mucosa is  pink   Labs/Other Tests and Data Reviewed:    Recent Labs: No results found for requested labs within last 8760 hours.   Wt Readings from Last 3 Encounters:  07/27/17 173 lb (78.5 kg)  05/23/16 180 lb (81.6 kg)  03/02/15 170 lb 6.4 oz (77.3 kg)     Other studies personally reviewed:    ASSESSMENT & PLAN:    Palpitations  Chronic Lyme   Continue inderal as she is now  Grateful that her chronic pain is better    COVID 19 screen The patient denies symptoms of COVID 19 at this time.  The importance of social distancing was discussed today.  Follow-up:  29m   Patient Risk:  after full review of this patients clinical status, I feel that they are at moderate risk at this time.  Today, I have spent *15 minutes with the patient with telehealth technology discussing the above.  Signed, Sherryl Manges, MD  10/04/2018 10:12 AM     [facility] [address] [place] [address] [phone] (office) [phone] (fax)

## 2018-11-20 ENCOUNTER — Other Ambulatory Visit: Payer: Self-pay | Admitting: Internal Medicine

## 2019-06-04 ENCOUNTER — Other Ambulatory Visit: Payer: Self-pay | Admitting: Internal Medicine

## 2019-07-31 ENCOUNTER — Other Ambulatory Visit (HOSPITAL_BASED_OUTPATIENT_CLINIC_OR_DEPARTMENT_OTHER): Payer: Self-pay | Admitting: Emergency Medicine

## 2019-07-31 DIAGNOSIS — R1031 Right lower quadrant pain: Secondary | ICD-10-CM

## 2019-08-06 ENCOUNTER — Other Ambulatory Visit: Payer: Self-pay

## 2019-08-06 ENCOUNTER — Ambulatory Visit (HOSPITAL_BASED_OUTPATIENT_CLINIC_OR_DEPARTMENT_OTHER)
Admission: RE | Admit: 2019-08-06 | Discharge: 2019-08-06 | Disposition: A | Discharge status: Home / Self Care | Payer: Managed Care, Other (non HMO) | Source: Ambulatory Visit | Attending: Emergency Medicine | Admitting: Emergency Medicine

## 2019-08-06 DIAGNOSIS — R1031 Right lower quadrant pain: Secondary | ICD-10-CM

## 2020-06-28 ENCOUNTER — Other Ambulatory Visit: Payer: Self-pay | Admitting: Internal Medicine

## 2020-07-25 ENCOUNTER — Other Ambulatory Visit: Payer: Self-pay | Admitting: Internal Medicine

## 2020-08-02 ENCOUNTER — Other Ambulatory Visit: Payer: Self-pay | Admitting: Internal Medicine

## 2020-08-03 ENCOUNTER — Telehealth: Payer: Self-pay | Admitting: Internal Medicine

## 2020-08-03 MED ORDER — PROPRANOLOL HCL ER 60 MG PO CP24: 60.0000 mg | ORAL_CAPSULE | Freq: Every day | ORAL | 0 refills | Status: DC

## 2020-08-03 NOTE — Telephone Encounter (Signed)
Pt's medication was sent to pt's pharmacy as requested. Confirmation received.  °

## 2020-08-03 NOTE — Telephone Encounter (Signed)
*  STAT* If patient is at the pharmacy, call can be transferred to refill team.   1. Which medications need to be refilled? (please list name of each medication and dose if known) propranolol ER (INDERAL LA) 60 MG 24 hr capsule  2. Which pharmacy/location (including street and city if local pharmacy) is medication to be sent to? [facility] - [place], [place] - [address]  3. Do they need a 30 day or 90 day supply? 90 day supply  Patient has an appointment scheduled for 11/12/20 with Dr. Graciela Husbands.

## 2020-11-05 ENCOUNTER — Other Ambulatory Visit: Payer: Self-pay | Admitting: Internal Medicine

## 2020-11-11 NOTE — Progress Notes (Signed)
Patient Care Team: Jeanann Lewandowsky, MD as PCP - General (Family Medicine)   HPI  Emily Richardson is a 45 y.o. female Seen in followup of palpitations She has done exceedingly well on low dose inderal   Previous Visit: Her last year has been incredibly stressful at home and she was passed her breaking point; and her daughter continues to struggle cancer issues. Although there remain challenges things are better. She went to [facility] and took away all of her medicines except for her Inderal. 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    The patient denies nocturnal dyspnea, orthopnea, or peripheral edema. There have been no lightheadedness or syncope.    Complains of some palpitations--stable unassoc with LH   Relasped of mono episode last year, however she has fully recovered.  Exercising some but not as much considering her days her focused on her daughter. Her daughter(16 y/o) continues to have migraines; her primary focus is her to recent mental health deteriorating. No exertional chest pain or shortness of breath.   She is babysitting part-time for consistent income.  Her son has been accepted at the [employer], she reminds me her father was a Engineer, petroleum.  Marital struggles in addition Emily Richardson has been a great blessing      DATE [facility]  12/17 5.1 0.84 15.0    Past Medical History:  Diagnosis Date  . Dysautonomia (HCC)   . Dysmenorrhea   . GERD (gastroesophageal reflux disease)   . Grave's disease   . Grave's disease   . Inappropriate sinus tachycardia   . Migraine headache     Past Surgical History:  Procedure Laterality Date  . sinus surgery (other)    . thryroidectomy      Current Outpatient Medications  Medication Sig Dispense Refill  . Ascorbic Acid (VITAMIN C) 1000 MG tablet Take 1,000 mg by mouth daily.    . fish oil-omega-3 fatty acids 1000 MG capsule Take 1 g by mouth daily.    Marland Kitchen lactase (LACTAID) 3000 units tablet Take 1 tablet by  mouth daily.    Marland Kitchen Lysine 1000 MG TABS Take 1 tablet by mouth daily.    . NON FORMULARY Take 1 capsule by mouth 2 (two) times daily. Methylated B vitamins    . NON FORMULARY Take 1 capsule by mouth daily. Tumeric    . OVER THE COUNTER MEDICATION Take 800 mg by mouth. Triple Magnesium complex    . Probiotic Product (PROBIOTIC-10 PO) Take 1 capsule by mouth daily.    . Progesterone Micronized (PROGESTERONE PO) Take by mouth daily.    . propranolol ER (INDERAL LA) 60 MG 24 hr capsule Take 1 capsule (60 mg total) by mouth daily. Please keep upcoming appt in May 2066m with Dr. Graciela Husbands before anymore refills. Thank you Final Attempt 30 capsule 0  . selenium 200 MCG TABS tablet Take 1 tablet by mouth daily.    . TESTOSTERONE TD Place onto the skin.    Marland Kitchen thyroid (NP THYROID) 30 MG tablet Take 60 mg by mouth daily before breakfast.    . valACYclovir (VALTREX) 500 MG tablet Take 2 tablets by mouth as needed. For outbreak in eye     No current facility-administered medications for this visit.    Allergies  Allergen Reactions  . Sulfonamide Derivatives Rash    Review of Systems negative except from HPI and PMH  Physical Exam BP 104/72   Pulse (!) 52  Ht 5\' 10"  (1.778 m)   Wt 182 lb (82.6 kg)   SpO2 98%   BMI 26.11 kg/m  Well developed and nourished in no acute distress HENT normal Neck supple  Clear Regular rate and rhythm, no murmurs or gallops No Clubbing cyanosis edema Skin-warm and dry A & Oriented  Grossly normal sensory and motor function  ECG sinsu 52 13/10/43 rsr' Low volts  R' new and voltage lower form 2019 but not much different from 2013   Assessment and  Plan Palpitations  Stress   Continue propranolol 60 daily  Long discussion regarding stresses-- wow  The time spend this day on the patients care with chart review and directly in consultation with the patient was 33 min.   I,Emily Richardson,acting as a scribe for 2014, MD.,have documented all relevant  documentation on the behalf of Emily Manges, MD,as directed by  Emily Manges, MD while in the presence of Emily Manges, MD. I, Emily Manges, MD, have reviewed all documentation for this visit. The documentation on 11/13/20 for the exam, diagnosis, procedures, and orders are all accurate and complete.

## 2020-11-12 ENCOUNTER — Encounter: Payer: Self-pay | Admitting: Internal Medicine

## 2020-11-12 ENCOUNTER — Other Ambulatory Visit: Payer: Self-pay

## 2020-11-12 ENCOUNTER — Ambulatory Visit (INDEPENDENT_AMBULATORY_CARE_PROVIDER_SITE_OTHER): Payer: BC Managed Care – PPO | Admitting: Internal Medicine

## 2020-11-12 VITALS — BP 104/72 | HR 52 | Ht 70.0 in | Wt 182.0 lb

## 2020-11-12 DIAGNOSIS — I4711 Inappropriate sinus tachycardia, so stated: Secondary | ICD-10-CM

## 2020-11-12 DIAGNOSIS — I951 Orthostatic hypotension: Secondary | ICD-10-CM | POA: Diagnosis not present

## 2020-11-12 DIAGNOSIS — R000 Tachycardia, unspecified: Secondary | ICD-10-CM

## 2020-11-12 DIAGNOSIS — R002 Palpitations: Secondary | ICD-10-CM

## 2020-11-12 NOTE — Patient Instructions (Signed)

## 2020-12-13 ENCOUNTER — Other Ambulatory Visit: Payer: Self-pay | Admitting: Internal Medicine

## 2021-02-23 MED ORDER — PROPRANOLOL HCL ER 60 MG PO CP24: 60.0000 mg | ORAL_CAPSULE | Freq: Every day | ORAL | 2 refills | Status: DC

## 2021-02-24 MED ORDER — PROPRANOLOL HCL 40 MG PO TABS: ORAL_TABLET | ORAL | 0 refills | Status: AC

## 2021-05-24 ENCOUNTER — Telehealth: Payer: Self-pay | Admitting: *Deleted

## 2021-05-24 NOTE — Telephone Encounter (Signed)
   Pre-operative Risk Assessment    Patient Name: Sharell Hilmer  DOB: 06/04/1976 MRN: [id number]     Request for Surgical Clearance   Procedure:  Dental Extraction - No. of Teeth:  4 TEETH TO BE EXTRACTED  Date of Surgery: Clearance TBD                                 Surgeon:  DR. Inda Coke, DDS Surgeon's Group or Practice Name:  [facility] Phone number:  [phone] Fax number:  [phone]   Type of Clearance Requested: - Medical    Type of Anesthesia:   Local  WITH EPI AND IV SEDATION   Additional requests/questions:   Elpidio Anis   05/24/2021, 3:29 PM

## 2021-05-25 NOTE — Telephone Encounter (Signed)
   Name: Emily Richardson  DOB: 11-10-1975  MRN: [id number]   Primary Cardiologist: Sherryl Manges, MD  Chart reviewed as part of pre-operative protocol coverage. Patient was contacted 05/25/2021 in reference to pre-operative risk assessment for pending surgery as outlined below.  Emily Richardson was last seen on 11/12/20 by Dr. Graciela Husbands.  Since that day, [employer] has done well. She does not have a history of ischemic heart disease. She is able to complete more than 4.0 METS without angina. She has a history of tachycardia and palpitations, recommend avoiding epinephrine. She is on no anticoagulation or antiplatelet medications. She does not require SBE PPX from a cardiology perspective.    Therefore, based on ACC/AHA guidelines, the patient would be at acceptable risk for the planned procedure without further cardiovascular testing.   The patient was advised that if she develops new symptoms prior to surgery to contact our office to arrange for a follow-up visit, and she verbalized understanding.  I will route this recommendation to the requesting party via Epic fax function and remove from pre-op pool. Please call with questions.  Roe Rutherford Brocha Gilliam, PA 05/25/2021, 8:54 AM

## 2021-08-16 ENCOUNTER — Emergency Department (HOSPITAL_COMMUNITY)
Admission: EM | Admit: 2021-08-16 | Discharge: 2021-08-16 | Disposition: A | Discharge status: Home / Self Care | Payer: 59 | Attending: Student | Admitting: Student

## 2021-08-16 ENCOUNTER — Emergency Department (HOSPITAL_COMMUNITY): Payer: 59

## 2021-08-16 ENCOUNTER — Other Ambulatory Visit: Payer: Self-pay

## 2021-08-16 ENCOUNTER — Encounter: Payer: Self-pay | Admitting: Internal Medicine

## 2021-08-16 ENCOUNTER — Telehealth: Payer: Self-pay | Admitting: Internal Medicine

## 2021-08-16 DIAGNOSIS — R0789 Other chest pain: Secondary | ICD-10-CM | POA: Diagnosis not present

## 2021-08-16 DIAGNOSIS — R200 Anesthesia of skin: Secondary | ICD-10-CM | POA: Insufficient documentation

## 2021-08-16 DIAGNOSIS — R000 Tachycardia, unspecified: Secondary | ICD-10-CM | POA: Insufficient documentation

## 2021-08-16 DIAGNOSIS — Z20822 Contact with and (suspected) exposure to covid-19: Secondary | ICD-10-CM | POA: Insufficient documentation

## 2021-08-16 LAB — D-DIMER, QUANTITATIVE: D-Dimer, Quant: 1.66 ug/mL-FEU — ABNORMAL HIGH (ref 0.00–0.50)

## 2021-08-16 LAB — TROPONIN I (HIGH SENSITIVITY)
Troponin I (High Sensitivity): 3 ng/L (ref <18)
Troponin I (High Sensitivity): 3 ng/L (ref <18)

## 2021-08-16 LAB — CBC
HCT: 46.5 % — ABNORMAL HIGH (ref 36.0–46.0)
Hemoglobin: 16 g/dL — ABNORMAL HIGH (ref 12.0–15.0)
MCH: 30.8 pg (ref 26.0–34.0)
MCHC: 34.4 g/dL (ref 30.0–36.0)
MCV: 89.6 fL (ref 80.0–100.0)
Platelets: 247 10*3/uL (ref 150–400)
RBC: 5.19 MIL/uL — ABNORMAL HIGH (ref 3.87–5.11)
RDW: 12.2 % (ref 11.5–15.5)
WBC: 6.9 10*3/uL (ref 4.0–10.5)
nRBC: 0 % (ref 0.0–0.2)

## 2021-08-16 LAB — BASIC METABOLIC PANEL
Anion gap: 9 (ref 5–15)
BUN: 10 mg/dL (ref 6–20)
CO2: 25 mmol/L (ref 22–32)
Calcium: 9.4 mg/dL (ref 8.9–10.3)
Chloride: 104 mmol/L (ref 98–111)
Creatinine, Ser: 0.73 mg/dL (ref 0.44–1.00)
GFR, Estimated: >60 mL/min (ref >60)
Glucose, Bld: 115 mg/dL — ABNORMAL HIGH (ref 70–99)
Potassium: 4.1 mmol/L (ref 3.5–5.1)
Sodium: 138 mmol/L (ref 135–145)

## 2021-08-16 LAB — I-STAT BETA HCG BLOOD, ED (MC, WL, AP ONLY): I-stat hCG, quantitative: <5 m[IU]/mL (ref <5)

## 2021-08-16 LAB — TSH: TSH: 2.716 u[IU]/mL (ref 0.350–4.500)

## 2021-08-16 LAB — RESP PANEL BY RT-PCR (FLU A&B, COVID) ARPGX2
Influenza A by PCR: NEGATIVE
Influenza B by PCR: NEGATIVE
SARS Coronavirus 2 by RT PCR: NEGATIVE

## 2021-08-16 MED ORDER — NITROGLYCERIN 0.4 MG SL SUBL: 0.4000 mg | SUBLINGUAL_TABLET | SUBLINGUAL | Status: DC | PRN

## 2021-08-16 MED ORDER — IOHEXOL 350 MG/ML SOLN
65.0000 mL | Freq: Once | INTRAVENOUS | Status: AC | PRN
  Administered 2021-08-16: 65 mL via INTRAVENOUS

## 2021-08-16 NOTE — Telephone Encounter (Signed)
Called and spoke with patient regarding reported symptoms. Pt does deny SOB/ breathing difficulties or cough. Pt states she has not made changes to her usual medication routine, but has recently been on some antibiotics and pain medications that is not her usual, however, she denies that these symptoms began or occurred in conjunction with starting or completing these medications. Pt condones shoulder discomfort, but attributes to not sleeping well and inability to get comfortable at night. States she is just concerned with her upcoming surgery, and wants to be sure she is healthy and good to proceed with that. Advised pt that we likely need to do some blood work and get an EKG, but she prefers to have Dr Caryl Comes review the message and give recommendations. Will forward to him to address. Gave ED precautions.

## 2021-08-16 NOTE — ED Provider Notes (Signed)
[facility] EMERGENCY DEPARTMENT Provider Note  CSN: [id number] Arrival date & time: 08/16/21 1118  Chief Complaint(s) Chest Pain and Arm Pain  HPI Emily Richardson is a 46 y.o. female with PMH dysautonomia, Graves' disease, inappropriate sinus tachycardia who presents emergency department for evaluation of chest pain and arm pain.  States that for the last 1 week she has had intermittent chest pressure.  She states that she was chasing after her dog today and had significant worsening of her chest pressure and numbness in all of her extremities.  She spoke with the triage nurse of her cardiologist who recommended ER presentation.  She arrives with improved chest pressure but it does still remain.  No associated diaphoresis, nausea, vomiting, headache, cough, fever or other systemic symptoms.  Patient hemodynamically stable on arrival.   Chest Pain Arm Pain Associated symptoms include chest pain.   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 Authorizing Provider  Ascorbic Acid (VITAMIN C) 1000 MG tablet Take 1,000 mg by mouth daily.    [provider]  fish oil-omega-3 fatty acids 1000 MG capsule Take 1 g by mouth daily.    [provider]  lactase (LACTAID) 3000 units tablet Take 1 tablet by mouth daily.    [provider]  Lysine 1000 MG TABS Take 1 tablet by mouth daily.    [provider]  NON FORMULARY Take 1 capsule by mouth 2 (two) times daily. Methylated B vitamins    [provider]  NON FORMULARY Take 1 capsule by mouth daily. Tumeric    [provider]  OVER THE COUNTER MEDICATION Take 800 mg by mouth. Triple Magnesium complex    [provider]  Probiotic Product (PROBIOTIC-10 PO) Take 1 capsule by mouth daily.    [provider]  Progesterone Micronized (PROGESTERONE PO) Take by mouth daily.    [provider]  propranolol (INDERAL) 40 MG tablet Take one tablet by mouth as needed for breakthrough episodes 02/24/21   Duke Salvia, MD  propranolol ER (INDERAL LA) 60 MG 24 hr capsule Take 1 capsule (60 mg total) by mouth daily. 02/23/21   Duke Salvia, MD  selenium 200 MCG TABS tablet Take 1 tablet by mouth daily.    [provider]  TESTOSTERONE TD Place onto the skin.    [provider]  thyroid (NP THYROID) 30 MG tablet Take 60 mg by mouth daily before breakfast.    [provider]  valACYclovir (VALTREX) 500 MG tablet Take 2 tablets by mouth as needed. For outbreak in eye    [provider]  Past Surgical History Past Surgical History:  Procedure Laterality Date   sinus surgery (other)     thryroidectomy     Family History No family history on file.  Social History Social History   Tobacco Use   Smoking status: Never   Smokeless tobacco: Never  Substance Use Topics   Alcohol use: Yes    Comment: socially   Drug use: No   Allergies Sulfonamide derivatives  Review of Systems Review of Systems  Cardiovascular:  Positive for chest pain.   Physical Exam Vital Signs  I have reviewed the triage vital signs BP 121/80    Pulse 90    Temp 98.7 F (37.1 C)  (Oral)    Resp 17    LMP 08/13/2021    SpO2 96%   Physical Exam Vitals and nursing note reviewed.  Constitutional:      General: She is not in acute distress.    Appearance: She is well-developed.  HENT:     Head: Normocephalic and atraumatic.  Eyes:     Conjunctiva/sclera: Conjunctivae normal.  Cardiovascular:     Rate and Rhythm: Normal rate and regular rhythm.     Heart sounds: No murmur heard. Pulmonary:     Effort: Pulmonary effort is normal. No respiratory distress.     Breath sounds: Normal breath sounds.  Abdominal:     Palpations: Abdomen is soft.     Tenderness: There is no abdominal tenderness.  Musculoskeletal:        General: No swelling.     Cervical back: Neck supple.  Skin:    General: Skin is warm and dry.     Capillary Refill: Capillary refill takes less than 2 seconds.  Neurological:     Mental Status: She is alert.  Psychiatric:        Mood and Affect: Mood normal.    ED Results and Treatments Labs (all labs ordered are listed, but only abnormal results are displayed) Labs Reviewed  BASIC METABOLIC PANEL - Abnormal; Notable for the following components:      Result Value   Glucose, Bld 115 (*)    All other components within normal limits  CBC - Abnormal; Notable for the following components:   RBC 5.19 (*)    Hemoglobin 16.0 (*)    HCT 46.5 (*)    All other components within normal limits  D-DIMER, QUANTITATIVE - Abnormal; Notable for the following components:   D-Dimer, Quant 1.66 (*)    All other components within normal limits  RESP PANEL BY RT-PCR (FLU A&B, COVID) ARPGX2  TSH  I-STAT BETA HCG BLOOD, ED ([facility], [facility], [facility] ONLY)  TROPONIN I (HIGH SENSITIVITY)  TROPONIN I (HIGH SENSITIVITY)                                                                                                                          Radiology DG Chest 2 View  Result Date: 08/16/2021 CLINICAL DATA:  Chest pain EXAM: CHEST -  2 VIEW COMPARISON:  04/04/2012 FINDINGS: The  heart size and mediastinal contours are within normal limits. Both lungs are clear. The visualized skeletal structures are unremarkable. IMPRESSION: No active cardiopulmonary disease. Electronically Signed   By: Ernie AvenaPalani  Rathinasamy M.D.   On: 08/16/2021 12:03   CT Angio Chest PE W and/or Wo Contrast  Result Date: 08/16/2021 CLINICAL DATA:  Chest pain. EXAM: CT ANGIOGRAPHY CHEST WITH CONTRAST TECHNIQUE: Multidetector CT imaging of the chest was performed using the standard protocol during bolus administration of intravenous contrast. Multiplanar CT image reconstructions and MIPs were obtained to evaluate the vascular anatomy. RADIATION DOSE REDUCTION: This exam was performed according to the departmental dose-optimization program which includes automated exposure control, adjustment of the mA and/or kV according to patient size and/or use of iterative reconstruction technique. CONTRAST:  65mL OMNIPAQUE IOHEXOL 350 MG/ML SOLN COMPARISON:  March 10, 2009. FINDINGS: Cardiovascular: Satisfactory opacification of the pulmonary arteries to the segmental level. No evidence of pulmonary embolism. Normal heart size. No pericardial effusion. Mediastinum/Nodes: No enlarged mediastinal, hilar, or axillary lymph nodes. Thyroid gland, trachea, and esophagus demonstrate no significant findings. Lungs/Pleura: Lungs are clear. No pleural effusion or pneumothorax. Upper Abdomen: No acute abnormality. Musculoskeletal: No chest wall abnormality. No acute or significant osseous findings. Review of the MIP images confirms the above findings. IMPRESSION: No definite evidence of pulmonary embolus. No definite abnormality seen in the chest. Electronically Signed   By: Lupita RaiderJames  Green Jr M.D.   On: 08/16/2021 16:08    Pertinent labs & imaging results that were available during my care of the patient were reviewed by me and considered in my medical decision making (see MDM for details).  Medications Ordered in ED Medications   nitroGLYCERIN (NITROSTAT) SL tablet 0.4 mg (has no administration in time range)  iohexol (OMNIPAQUE) 350 MG/ML injection 65 mL (65 mLs Intravenous Contrast Given 08/16/21 1556)                                                                                                                                     Procedures Procedures  (including critical care time)  Medical Decision Making / ED Course   This patient presents to the ED for concern of chest pressure, this involves an extensive number of treatment options, and is a complaint that carries with it a high risk of complications and morbidity.  The differential diagnosis includes ACS, PE, thyroid abnormality, electrolyte abnormality, dysautonomia, anxiety  MDM: Patient seen emergency department for evaluation of chest pressure and arm numbness.  Physical exam is unremarkable.  Laboratory evaluation largely unremarkable outside of a hemoglobin of 16 which is baseline for this patient and an elevated D-dimer 1.6.  Chest x-ray unremarkable.  CT PE obtained which is also reassuringly negative.  On reevaluation, patient symptoms have improved with no intervention and I spoke with the patient's primary cardiologist Dr. Graciela HusbandsKlein who states that he will help arrange follow-up.  Patient  has a heart score of 2 and is safe for outpatient follow-up.   Additional history obtained:  -External records from outside source obtained and reviewed including: Chart review including previous notes, labs, imaging, consultation notes   Lab Tests: -I ordered, reviewed, and interpreted labs.   The pertinent results include:   Labs Reviewed  BASIC METABOLIC PANEL - Abnormal; Notable for the following components:      Result Value   Glucose, Bld 115 (*)    All other components within normal limits  CBC - Abnormal; Notable for the following components:   RBC 5.19 (*)    Hemoglobin 16.0 (*)    HCT 46.5 (*)    All other components within normal limits   D-DIMER, QUANTITATIVE - Abnormal; Notable for the following components:   D-Dimer, Quant 1.66 (*)    All other components within normal limits  RESP PANEL BY RT-PCR (FLU A&B, COVID) ARPGX2  TSH  I-STAT BETA HCG BLOOD, ED ([facility], [facility], [facility] ONLY)  TROPONIN I (HIGH SENSITIVITY)  TROPONIN I (HIGH SENSITIVITY)      EKG   EKG Interpretation  Date/Time:  Tuesday August 16 2021 11:26:49 EST Ventricular Rate:  113 PR Interval:  122 QRS Duration: 76 QT Interval:  318 QTC Calculation: 436 R Axis:   222 Text Interpretation: Sinus tachycardia Right superior axis deviation When compared with ECG of 04-Apr-2012 01:26, PREVIOUS ECG IS PRESENT Confirmed by Kyran Whittier ([id number]) on 08/16/2021 1:18:36 PM         Imaging Studies ordered: I ordered imaging studies including CXR, CTPE I independently visualized and interpreted imaging. I agree with the radiologist interpretation   Medicines ordered and prescription drug management: Meds ordered this encounter  Medications   nitroGLYCERIN (NITROSTAT) SL tablet 0.4 mg   iohexol (OMNIPAQUE) 350 MG/ML injection 65 mL    -I have reviewed the patients home medicines and have made adjustments as needed  Critical interventions none3  Consultations Obtained: I requested consultation with the the patient's primary cardiologist Dr. Graciela Husbands,  and discussed lab and imaging findings as well as pertinent plan - they recommend: Outpatient follow-up   Cardiac Monitoring: The patient was maintained on a cardiac monitor.  I personally viewed and interpreted the cardiac monitored which showed an underlying rhythm of: NSR  Social Determinants of Health:  Factors impacting patients care include: none   Reevaluation: After the interventions noted above, I reevaluated the patient and found that they have :improved  Co morbidities that complicate the patient evaluation  Past Medical History:  Diagnosis Date   Dysautonomia (HCC)    Dysmenorrhea    GERD  (gastroesophageal reflux disease)    Grave's disease    Grave's disease    Inappropriate sinus tachycardia    Migraine headache       Dispostion: I considered admission for this patient, with a heart score of 2 and no persistent symptoms she is safe for discharge with outpatient follow-up     Final Clinical Impression(s) / ED Diagnoses Final diagnoses:  None     @PCDICTATION @    Noreene Boreman, , MD 08/16/21 1650

## 2021-08-16 NOTE — ED Triage Notes (Signed)
Pt. Stated, I started having chest pressure about a week ago but here in the last couple of days its been owrse with some pain in my arms.

## 2021-08-16 NOTE — ED Provider Triage Note (Signed)
Emergency Medicine Provider Triage Evaluation Note  Nelma Rothman , a 46 y.o. female  was evaluated in triage.  Pt complains of chest pressure for about a week.  Patient states that she was chasing after her dog and felt not only chest pain but numbness in all her extremities.  She states the last couple days she has had centralized chest pressure that getting worse.  She also reports sleeping difficulty.  Review of Systems  Positive: As above Negative: Fever, chills, cough  Physical Exam  BP (!) 117/92 (BP Location: Right Arm)    Pulse (!) 112    Temp 98.7 F (37.1 C) (Oral)    Resp 16    LMP 08/13/2021    SpO2 97%  Gen:   Awake, no distress   Resp:  Normal effort  MSK:   Moves extremities without difficulty  Other:    Medical Decision Making  Medically screening exam initiated at 11:42 AM.  Appropriate orders placed.  Sonnia Blouch was informed that the remainder of the evaluation will be completed by another provider, this initial triage assessment does not replace that evaluation, and the importance of remaining in the ED until their evaluation is complete.     Miroslava Santellan T, PA-C 08/16/21 1143

## 2021-08-16 NOTE — Telephone Encounter (Signed)
Dr. Posey Rea is calling requesting to speak with Dr. Graciela Husbands to touch base after doing a workup on the patient that was sent to the ED by Dr. Graciela Husbands.

## 2021-08-16 NOTE — Telephone Encounter (Signed)
Spoke with pt and advised due to her current active chest pain/discomfort, not feeling well and tingling in her arms would recommend pt be seen in the ED now for further evaluation.  Pt verbalizes understanding and agrees with current plan.

## 2021-08-16 NOTE — Telephone Encounter (Signed)
Dr Caryl Comes has spoken with Dr Matilde Sprang re:pt

## 2021-08-24 ENCOUNTER — Ambulatory Visit (INDEPENDENT_AMBULATORY_CARE_PROVIDER_SITE_OTHER): Payer: 59 | Admitting: Internal Medicine

## 2021-08-24 ENCOUNTER — Other Ambulatory Visit: Payer: Self-pay

## 2021-08-24 ENCOUNTER — Encounter: Payer: Self-pay | Admitting: Internal Medicine

## 2021-08-24 VITALS — BP 110/72 | HR 85 | Ht 70.0 in | Wt 191.4 lb

## 2021-08-24 DIAGNOSIS — I951 Orthostatic hypotension: Secondary | ICD-10-CM | POA: Diagnosis not present

## 2021-08-24 DIAGNOSIS — R7989 Other specified abnormal findings of blood chemistry: Secondary | ICD-10-CM | POA: Diagnosis not present

## 2021-08-24 DIAGNOSIS — M79604 Pain in right leg: Secondary | ICD-10-CM | POA: Diagnosis not present

## 2021-08-24 DIAGNOSIS — M79605 Pain in left leg: Secondary | ICD-10-CM

## 2021-08-24 NOTE — Patient Instructions (Signed)
Medication Instructions:  ?Your physician recommends that you continue on your current medications as directed. Please refer to the Current Medication list given to you today. ? ?*If you need a refill on your cardiac medications before your next appointment, please call your pharmacy* ? ? ?Lab Work: ?None ordered ? ? ?Testing/Procedures: ?Your physician has requested that you have a lower extremity venous duplex. This test is an ultrasound of the veins in the legs. It looks at venous blood flow that carries blood from the heart to the legs. Allow one hour for a Lower Venous exam. Allow thirty minutes for an Upper Venous exam. There are no restrictions or special instructions. ? ? ?Follow-Up: ?At [facility], you and your health needs are our priority.  As part of our continuing mission to provide you with exceptional heart care, we have created designated Provider Care Teams.  These Care Teams include your primary Cardiologist (physician) and Advanced Practice Providers (APPs -  Physician Assistants and Nurse Practitioners) who all work together to provide you with the care you need, when you need it. ? ?Your next appointment:   ?1 year(s) ? ?The format for your next appointment:   ?In Person ? ?Provider:   ?Sherryl Manges, MD ? ? ? ?Thank you for choosing [facility]!! ? ? ?Dory Horn, RN ?[phone] ? ? ?Other Instructions ? ?  ?

## 2021-08-24 NOTE — Progress Notes (Signed)
? ? ? ? ? ?Patient Care Team: ?Jeanann Lewandowsky, MD as PCP - General (Family Medicine) ?Duke Salvia, MD as PCP - Cardiology (Cardiology) ? ? ?HPI ? ?Emily Richardson is a 46 y.o. female ?Seen in followup of palpitations She has done exceedingly well on low dose inderal  ? ?Previous Visit: ?Her last year has been incredibly stressful at home and she was passed her breaking point; and her daughter continues to struggle cancer issues. Although there remain challenges things are better. She went to [facility] and took away all of her medicines except for her Inderal. 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   ? ?Palpitations are her quiescient for the most part.  She was seen in the emergency room a week or so ago with atypical chest pain.  Troponins were negative.  D-dimer was elevated.  CTA was negative.  Also complained however of left leg greater than right leg cramping and some swelling. ? ?Her daughter underwent major breast reduction surgery earlier this year (34 K--34 C) ?Unfortunately, became suicidal in conjunction with using pain medications.  Remains incredibly stressful and fragile ? ?Her son acceptance to the [employer] was disrupted when he tore his meniscus prior to reporting.  He is reapplied and the anticipate acceptance., she reminds me her father was a Engineer, petroleum.    ?Troy Sine and Larita Fife remain a great blessing ?   ? ?DATE [facility]  ?12/17 5.1 0.84 15.0  ? ? ?Past Medical History:  ?Diagnosis Date  ? Dysautonomia (HCC)   ? Dysmenorrhea   ? GERD (gastroesophageal reflux disease)   ? Grave's disease   ? Grave's disease   ? Inappropriate sinus tachycardia   ? Migraine headache   ? ? ?Past Surgical History:  ?Procedure Laterality Date  ? sinus surgery (other)    ? thryroidectomy    ? ? ?Current Outpatient Medications  ?Medication Sig Dispense Refill  ? Ascorbic Acid (VITAMIN C) 1000 MG tablet Take 1,000 mg by mouth daily.    ? fish oil-omega-3 fatty acids 1000 MG capsule Take 1 g by  mouth daily.    ? lactase (LACTAID) 3000 units tablet Take 1 tablet by mouth daily.    ? Lysine 1000 MG TABS Take 1 tablet by mouth daily.    ? NON FORMULARY Take 1 capsule by mouth 2 (two) times daily. Methylated B vitamins    ? NON FORMULARY Take 1 capsule by mouth daily. Tumeric    ? OVER THE COUNTER MEDICATION Take 800 mg by mouth. Triple Magnesium complex    ? Probiotic Product (PROBIOTIC-10 PO) Take 1 capsule by mouth daily.    ? Progesterone Micronized (PROGESTERONE PO) Take by mouth daily.    ? propranolol (INDERAL) 40 MG tablet Take one tablet by mouth as needed for breakthrough episodes 30 tablet 0  ? propranolol ER (INDERAL LA) 60 MG 24 hr capsule Take 1 capsule (60 mg total) by mouth daily. 90 capsule 2  ? selenium 200 MCG TABS tablet Take 1 tablet by mouth daily.    ? TESTOSTERONE TD Place onto the skin.    ? thyroid (ARMOUR) 90 MG tablet Take 1 tablet by mouth daily.    ? valACYclovir (VALTREX) 500 MG tablet Take 2 tablets by mouth as needed. For outbreak in eye    ? ?No current facility-administered medications for this visit.  ? ? ?Allergies  ?Allergen Reactions  ? Sulfonamide Derivatives Rash  ? ? ?Review of Systems negative  except from HPI and PMH ? ?Physical Exam ?BP 110/72   Pulse 85   Ht 5\' 10"  (1.778 m)   Wt 191 lb 6.4 oz (86.8 kg)   LMP 08/13/2021   SpO2 99%   BMI 27.46 kg/m?  ?  ?Well developed and nourished in no acute distress ?HENT normal ?Neck supple with JVP-  flat   ?Clear ?Regular rate and rhythm, no murmurs or gallops ?Abd-soft with active BS ?No Clubbing cyanosis edema ?Skin-warm and dry ?A & Oriented  Grossly normal sensory and motor function ? ?ECG 2/23 sinus at 113 with right axis deviation ?Intervals 05/27/1931 ? ?ECG 5/22 sinus at 52 ?13/10/43 ?rsr' ?Low volts  ?R' new and voltage lower form 2019 but not much different from 2013  ? ?Assessment and  Plan ?Palpitations ? ?Elevated D-dimer with an abnormal ECG with new right axis.  ? ?Presurgical evaluation ? ? CTA report was  reviewed.  With her left leg cramping, we will check a venous Doppler.  Also need to repeat an ECG.  We will arrange that tomorrow when she goes to [facility] line ? ?Continue propranolol 60 mg daily. ? ?She is scheduled to undergo GYN surgery for fallopian and ovarian cysts.  If the venous Dopplers are negative I do not see a cardiac exclusion. ? ? ?  ? ?

## 2021-08-25 ENCOUNTER — Ambulatory Visit (INDEPENDENT_AMBULATORY_CARE_PROVIDER_SITE_OTHER): Payer: 59 | Admitting: *Deleted

## 2021-08-25 ENCOUNTER — Ambulatory Visit (HOSPITAL_COMMUNITY)
Admission: RE | Admit: 2021-08-25 | Discharge: 2021-08-25 | Disposition: A | Discharge status: Home / Self Care | Payer: 59 | Source: Ambulatory Visit | Attending: Internal Medicine | Admitting: Internal Medicine

## 2021-08-25 VITALS — HR 89 | Ht 70.0 in | Wt 191.0 lb

## 2021-08-25 DIAGNOSIS — M79604 Pain in right leg: Secondary | ICD-10-CM | POA: Insufficient documentation

## 2021-08-25 DIAGNOSIS — R000 Tachycardia, unspecified: Secondary | ICD-10-CM

## 2021-08-25 DIAGNOSIS — M79605 Pain in left leg: Secondary | ICD-10-CM | POA: Diagnosis present

## 2021-08-25 DIAGNOSIS — R7989 Other specified abnormal findings of blood chemistry: Secondary | ICD-10-CM | POA: Diagnosis present

## 2021-08-25 DIAGNOSIS — R9431 Abnormal electrocardiogram [ECG] [EKG]: Secondary | ICD-10-CM

## 2021-08-25 NOTE — Progress Notes (Signed)
? ?  Nurse Visit  ?  ?Date of Encounter: 08/25/2021 ?ID: Emily Richardson, DOB Nov 07, 1975, MRN [id number] ? ?PCP:  Jeanann Lewandowsky, MD ?  ?Cardiologist:  Sherryl Manges, MD  ?Advanced Practice Provider:  No care team member to display ?Electrophysiologist:  None ?:[id number] ? ? ?Visit Details  ? ?VS:  Pulse 89   Ht 5\' 10"  (1.778 m)   Wt 191 lb (86.6 kg)   LMP 08/13/2021   BMI 27.41 kg/m?  , BMI Body mass index is 27.41 kg/m?. ? ?Wt Readings from Last 3 Encounters:  ?08/25/21 191 lb (86.6 kg)  ?08/24/21 191 lb 6.4 oz (86.8 kg)  ?11/12/20 182 lb (82.6 kg)  ?  ? ?Reason for visit: EKG ?Performed today: EKG--reviewed by Dr. 11/14/20 ?Changes (medications, testing, etc.) : None ?Length of Visit: 20 minutes ? ?Medications Adjustments/Labs and Tests Ordered: ?No orders of the defined types were placed in this encounter. ? ?No orders of the defined types were placed in this encounter. ? ? ?Signed, ?Graciela Husbands, RN  ?08/25/2021 5:09 PM ? ? ? ? ?

## 2021-11-29 ENCOUNTER — Other Ambulatory Visit: Payer: Self-pay

## 2021-11-29 MED ORDER — PROPRANOLOL HCL ER 60 MG PO CP24: 60.0000 mg | ORAL_CAPSULE | Freq: Every day | ORAL | 2 refills | Status: DC

## 2022-03-25 IMAGING — DX DG CHEST 2V
2 series · 2 of 2 positions shown · non-contrast
Comparison: 04/04/2012

CLINICAL DATA: Chest pain

EXAM:
CHEST - 2 VIEW

[w chest pa]
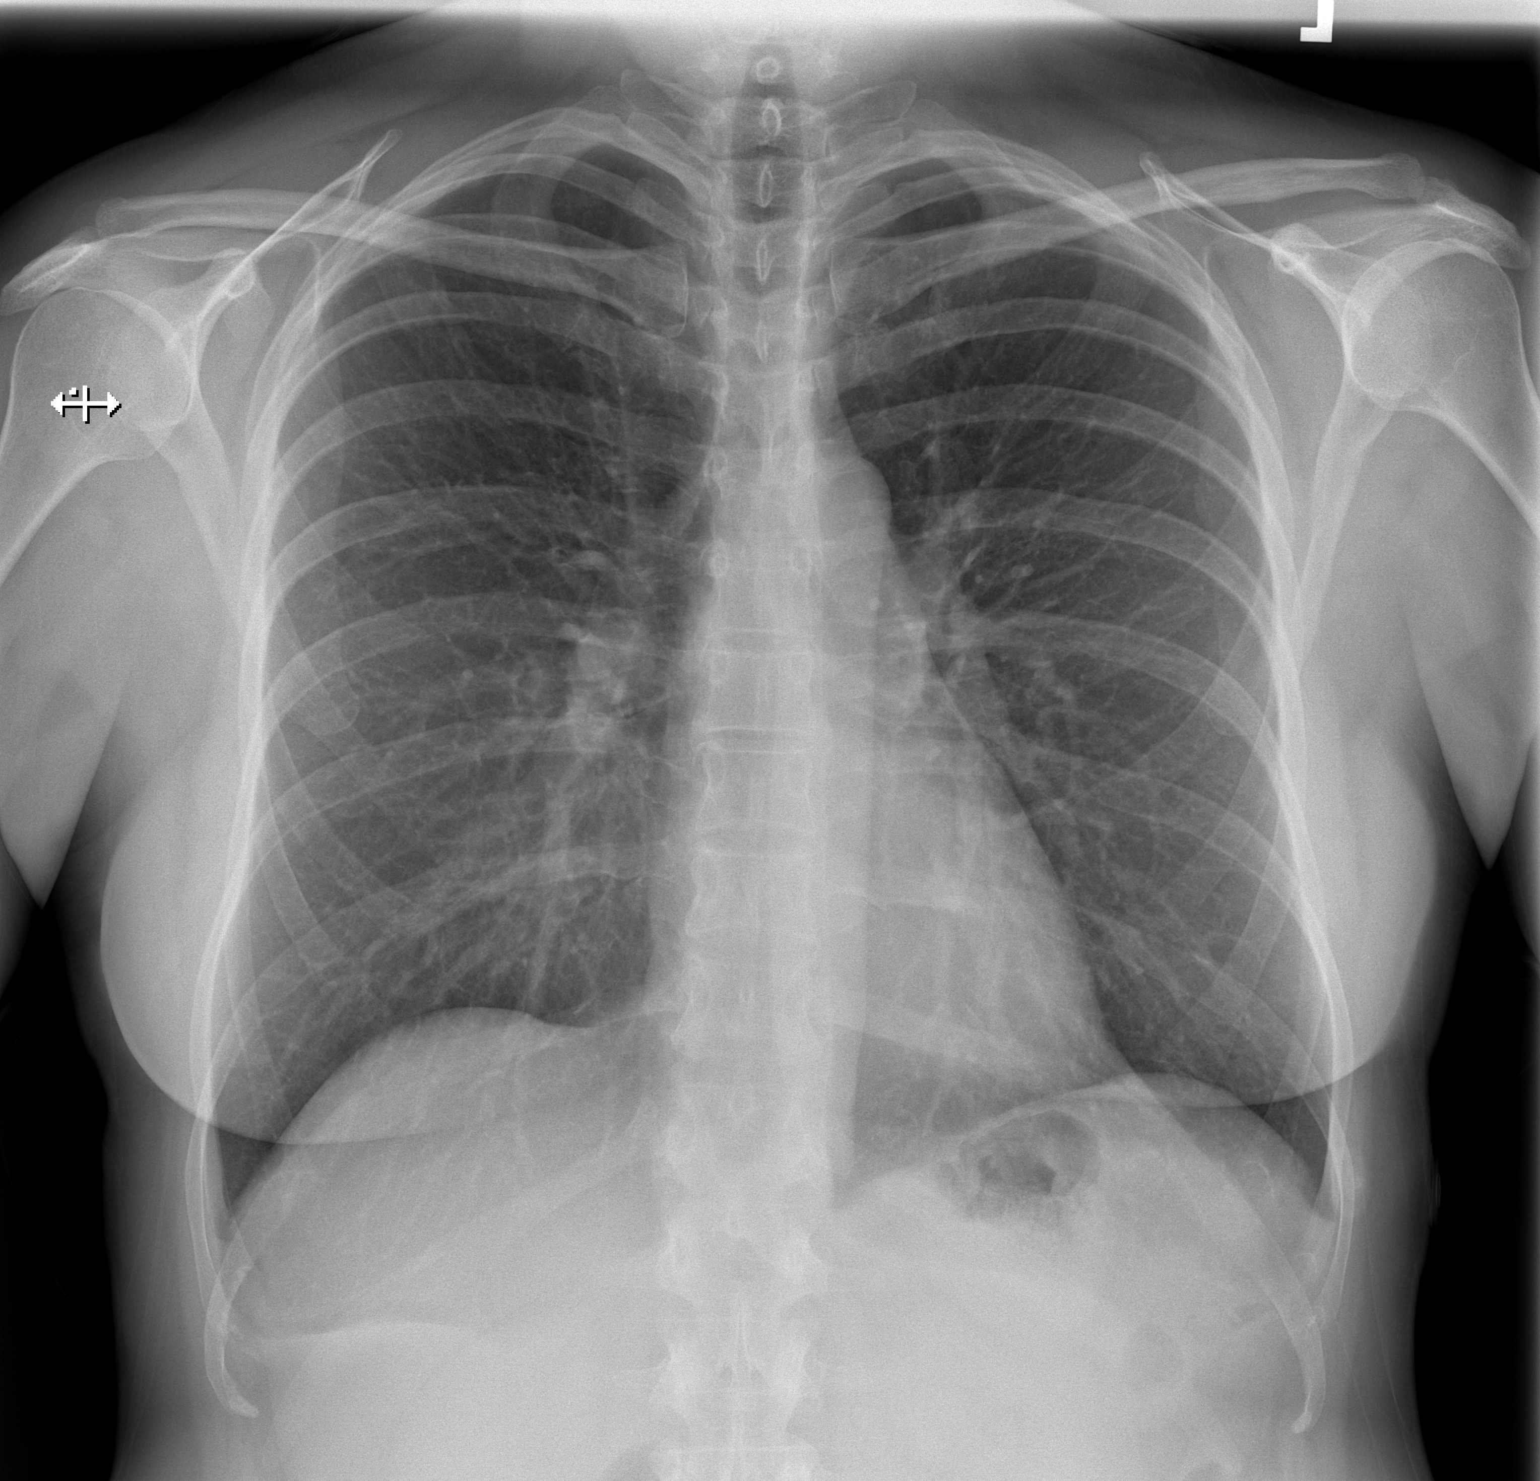

[w chest lat]
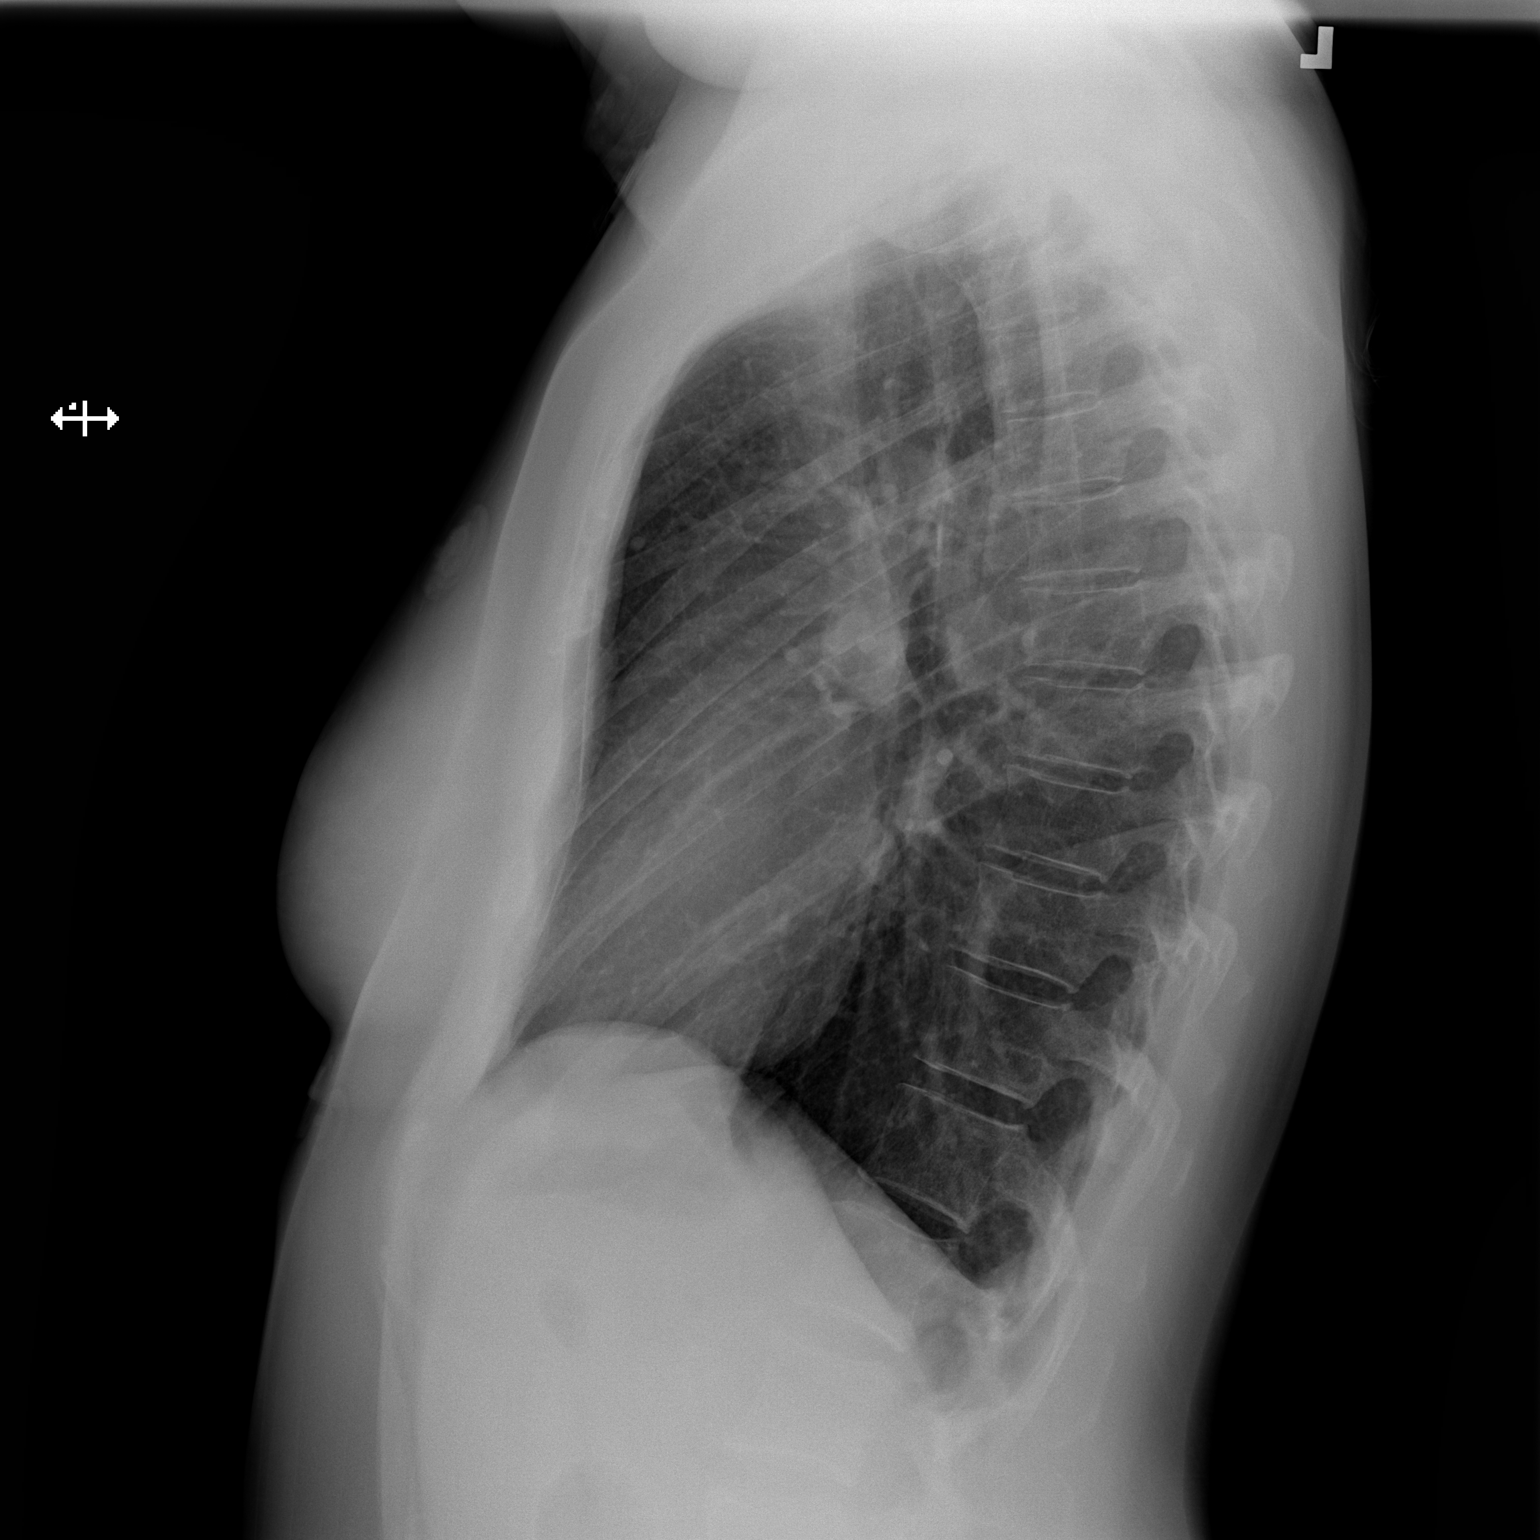

[2 of 2 positions shown; findings below may reference images not displayed]

FINDINGS: The heart size and mediastinal contours are within normal limits.
Both lungs are clear. The visualized skeletal structures are
unremarkable.
IMPRESSION: No active cardiopulmonary disease.

## 2022-03-25 IMAGING — CT CT ANGIO CHEST
2 of 6 series · 18 of 46 positions shown · IV contrast (agent unspecified)
Comparison: March 10, 2009.

CLINICAL DATA: Chest pain.

EXAM:
CT ANGIOGRAPHY CHEST WITH CONTRAST
TECHNIQUE: Multidetector CT imaging of the chest was performed using the
standard protocol during bolus administration of intravenous
contrast. Multiplanar CT image reconstructions and MIPs were
obtained to evaluate the vascular anatomy.

[Series 7: thins · axial · 0.66mm/px · z∈[+1157,+1448]mm · 15 of 319 slices shown]
[im 14/319  lung]
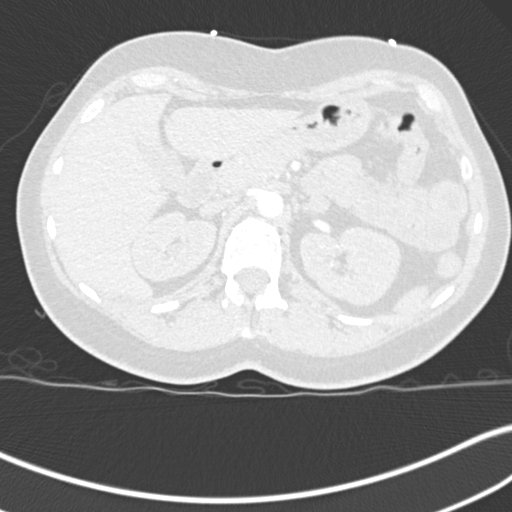
[im 42/319  soft-tissue]
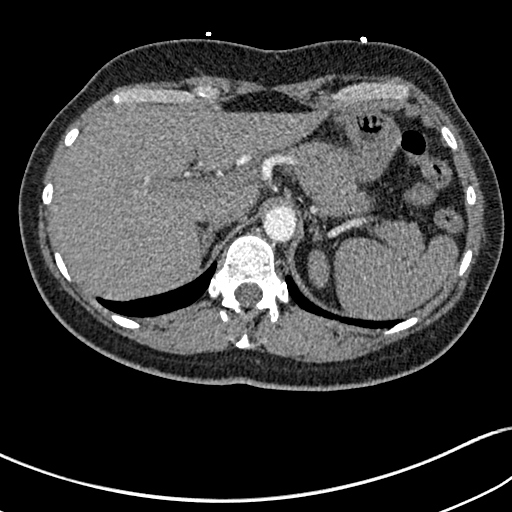
[im 56/319  lung]
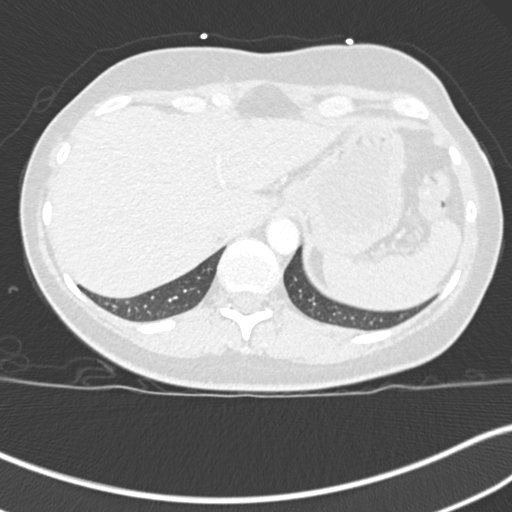
[im 83/319  soft-tissue]
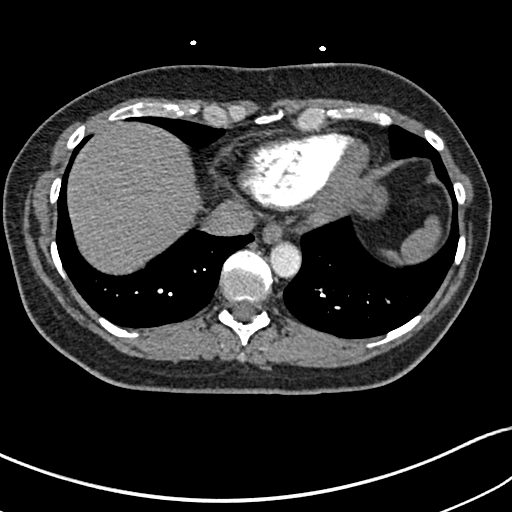
[im 97/319  lung]
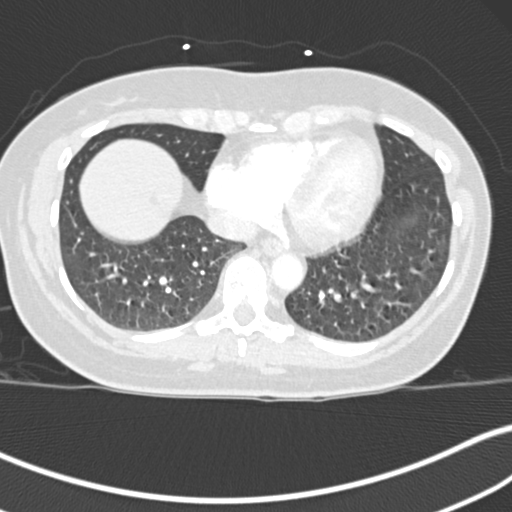
[im 125/319  soft-tissue]
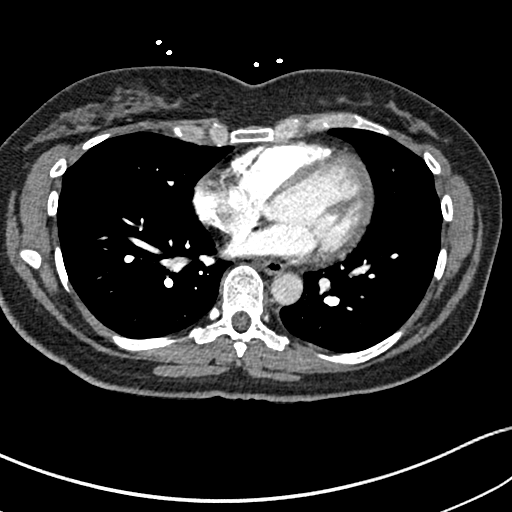
[im 139/319  lung]
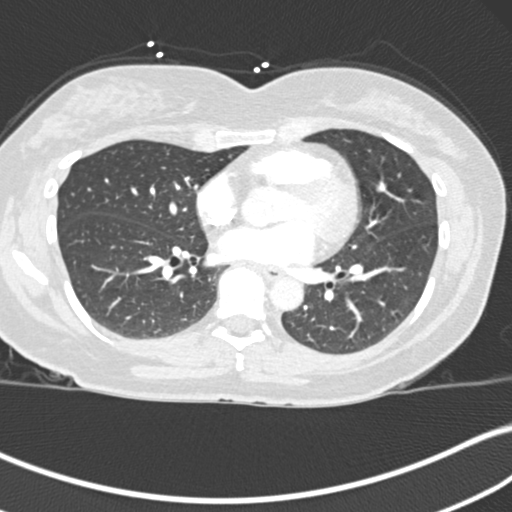
[im 166/319  soft-tissue]
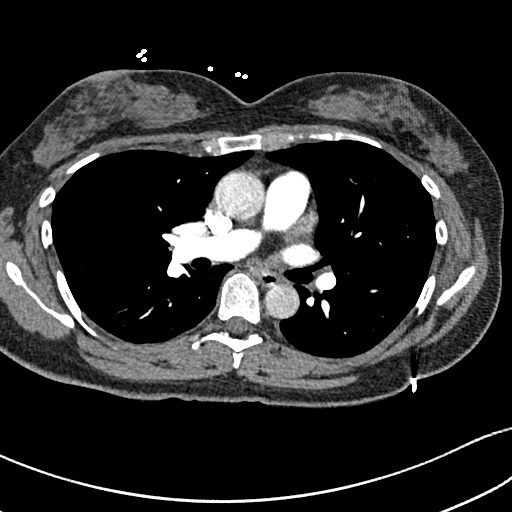
[im 180/319  lung]
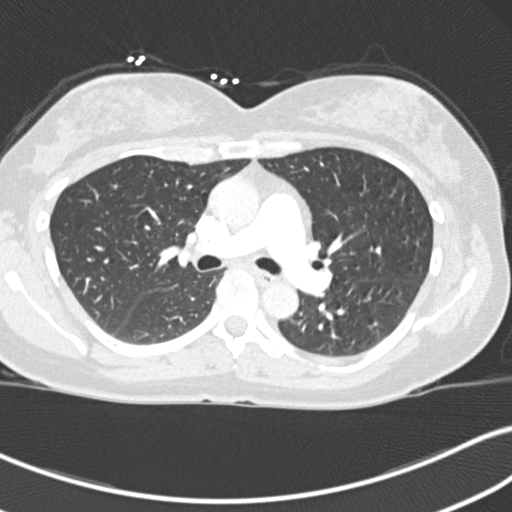
[im 194/319  soft-tissue]
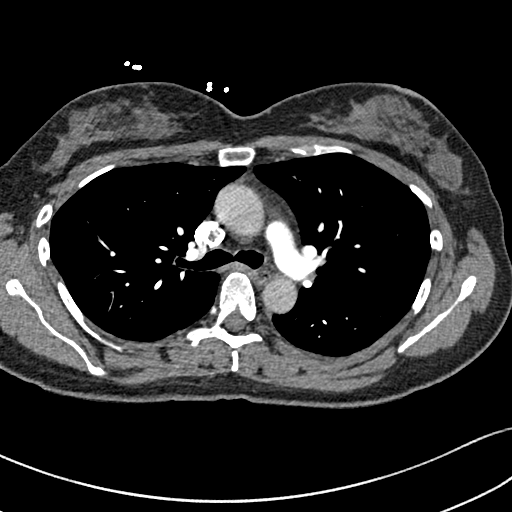
[im 222/319  lung]
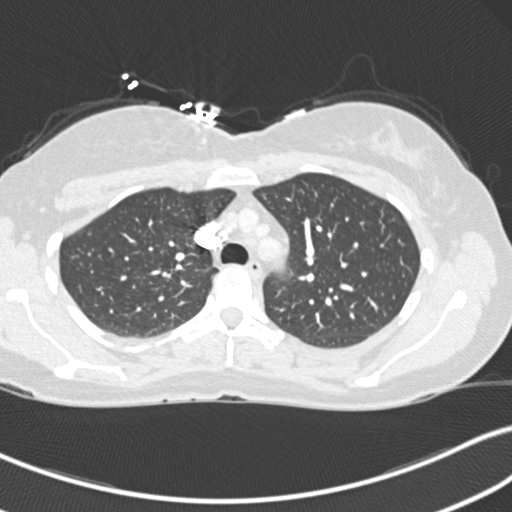
[im 236/319  soft-tissue]
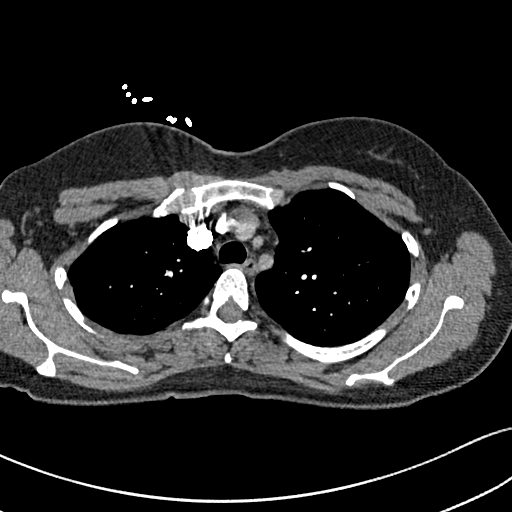
[im 263/319  lung]
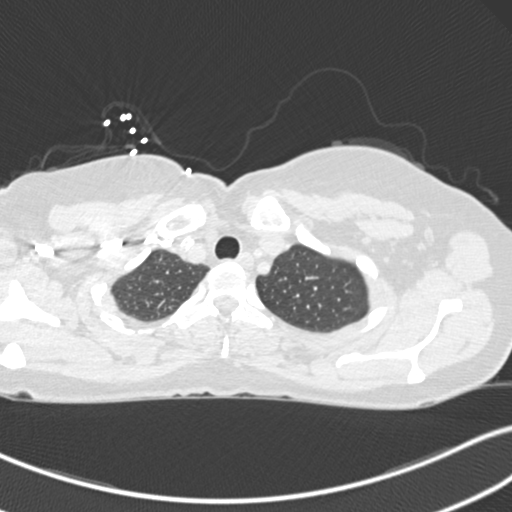
[im 277/319  soft-tissue]
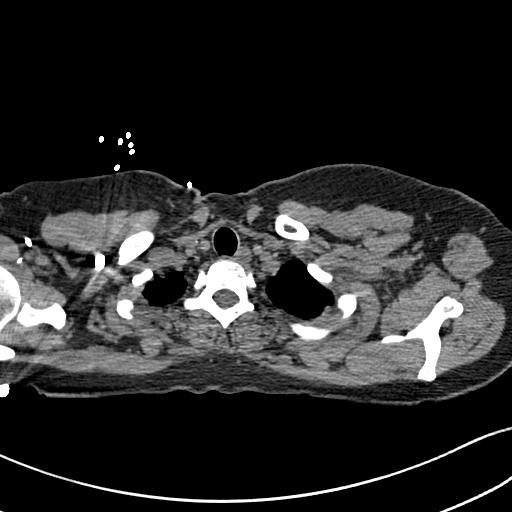
[im 305/319  lung]
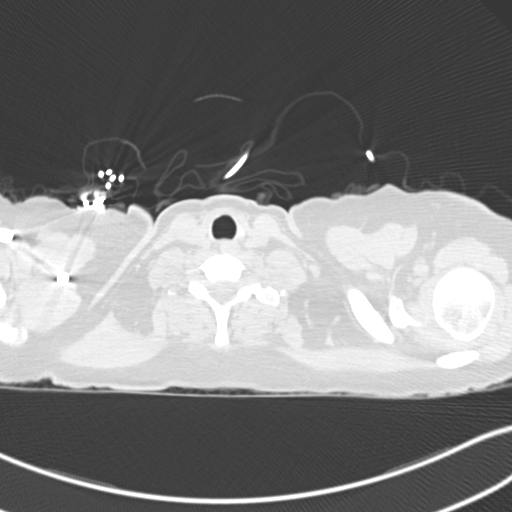

[Series 9: coronal mpr · coronal · 0.63mm/px · 3 of 139 slices shown]
[im 35/139  soft-tissue]
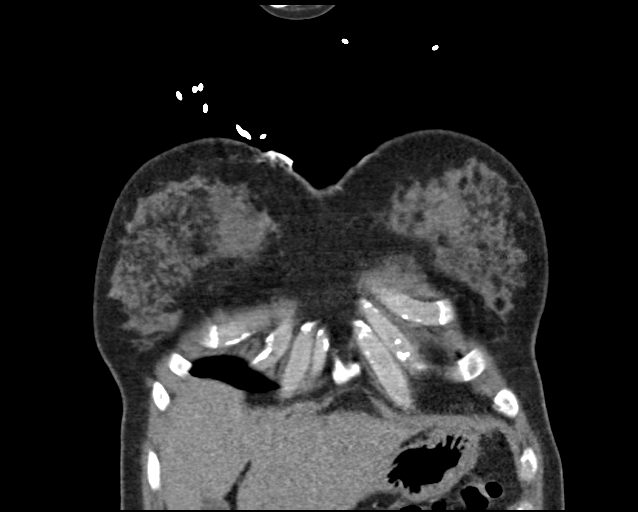
[im 70/139  soft-tissue]
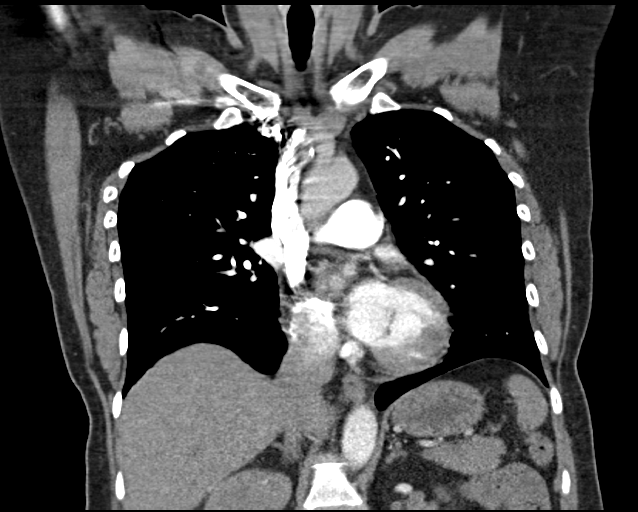
[im 104/139  soft-tissue]
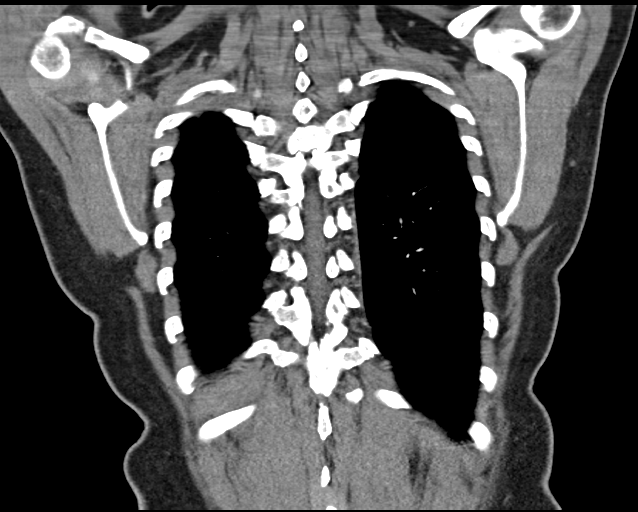

[18 of 46 positions shown; findings below may reference images not displayed]

RADIATION DOSE REDUCTION: This exam was performed according to the
departmental dose-optimization program which includes automated
exposure control, adjustment of the mA and/or kV according to
patient size and/or use of iterative reconstruction technique.

CONTRAST:  65mL OMNIPAQUE IOHEXOL 350 MG/ML SOLN
FINDINGS: Cardiovascular: Satisfactory opacification of the pulmonary arteries
to the segmental level. No evidence of pulmonary embolism. Normal
heart size. No pericardial effusion.

Mediastinum/Nodes: No enlarged mediastinal, hilar, or axillary lymph
nodes. Thyroid gland, trachea, and esophagus demonstrate no
significant findings.

Lungs/Pleura: Lungs are clear. No pleural effusion or pneumothorax.

Upper Abdomen: No acute abnormality.

Musculoskeletal: No chest wall abnormality. No acute or significant
osseous findings.

Review of the MIP images confirms the above findings.
IMPRESSION: No definite evidence of pulmonary embolus. No definite abnormality
seen in the chest.

## 2022-08-19 ENCOUNTER — Other Ambulatory Visit: Payer: Self-pay | Admitting: Internal Medicine

## 2022-09-26 ENCOUNTER — Ambulatory Visit: Payer: Commercial Managed Care - PPO | Admitting: Internal Medicine

## 2022-10-16 ENCOUNTER — Other Ambulatory Visit: Payer: Self-pay | Admitting: Internal Medicine

## 2022-12-22 ENCOUNTER — Ambulatory Visit: Payer: Self-pay | Admitting: Internal Medicine

## 2023-01-15 ENCOUNTER — Encounter: Payer: Self-pay | Admitting: General Practice

## 2023-02-25 ENCOUNTER — Other Ambulatory Visit: Payer: Self-pay | Admitting: Internal Medicine

## 2023-04-14 ENCOUNTER — Other Ambulatory Visit: Payer: Self-pay | Admitting: Internal Medicine

## 2023-05-10 ENCOUNTER — Ambulatory Visit: Payer: Self-pay | Admitting: Internal Medicine

## 2023-06-05 ENCOUNTER — Encounter: Payer: Self-pay | Admitting: Internal Medicine

## 2023-06-05 ENCOUNTER — Ambulatory Visit: Payer: Commercial Managed Care - PPO | Attending: Internal Medicine | Admitting: Internal Medicine

## 2023-06-05 VITALS — BP 120/86 | HR 74 | Ht 70.0 in | Wt 183.8 lb

## 2023-06-05 DIAGNOSIS — R002 Palpitations: Secondary | ICD-10-CM | POA: Diagnosis not present

## 2023-06-05 MED ORDER — PROPRANOLOL HCL ER 60 MG PO CP24: 60.0000 mg | ORAL_CAPSULE | Freq: Every day | ORAL | 3 refills | Status: DC

## 2023-06-05 NOTE — Patient Instructions (Signed)

## 2023-06-05 NOTE — Progress Notes (Signed)
Patient Care Team: Jaynee Eagles as PCP - General (Physician Assistant) Duke Salvia, MD as PCP - Cardiology (Cardiology)   HPI  Emily Richardson is a 47 y.o. female Seen in followup of palpitations She has done exceedingly well on low dose inderal   Previous Visit: Her last year has been incredibly stressful at home and she was passed her breaking point; and her daughter continues to struggle cancer issues. Although there remain challenges things are better. She went to [facility] and took away all of her medicines except for her Inderal. 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    Palpitations are her quiescient for the most part.  She was seen in the emergency room a week or so ago with atypical chest pain.  Troponins were negative.  D-dimer was elevated.  CTA was negative.  Also complained however of left leg greater than right leg cramping and some swelling.  Life has been incredibly stressful.  Her daughter is in a better position that she has been.  Unfortunately her son is in a precarious position with his ability to continue at the [employer] having gotten depressed, he was on a comment on [employer] for academic achievement.  He awaits in the next 2 weeks a decision from the superintendent.  Her husband left at Thanksgiving  In all of this her heart palpitations have been quiet           DATE [facility]  12/17 5.1 0.84 15.0    Past Medical History:  Diagnosis Date   Dysautonomia (HCC)    Dysmenorrhea    GERD (gastroesophageal reflux disease)    Grave's disease    Grave's disease    Inappropriate sinus tachycardia (HCC)    Migraine headache     Past Surgical History:  Procedure Laterality Date   sinus surgery (other)     thryroidectomy      Current Outpatient Medications  Medication Sig Dispense Refill   Ascorbic Acid (VITAMIN C) 1000 MG tablet Take 1,000 mg by mouth daily.     fish oil-omega-3 fatty acids 1000 MG capsule Take 1  g by mouth daily.     lactase (LACTAID) 3000 units tablet Take 1 tablet by mouth daily.     Lysine 1000 MG TABS Take 1 tablet by mouth daily.     NON FORMULARY Take 1 capsule by mouth 2 (two) times daily. Methylated B vitamins     NON FORMULARY Take 1 capsule by mouth daily. Tumeric     OVER THE COUNTER MEDICATION Take 800 mg by mouth. Triple Magnesium complex     Probiotic Product (PROBIOTIC-10 PO) Take 1 capsule by mouth daily.     Progesterone Micronized (PROGESTERONE PO) Take by mouth daily.     propranolol (INDERAL) 40 MG tablet Take one tablet by mouth as needed for breakthrough episodes 30 tablet 0   propranolol ER (INDERAL LA) 60 MG 24 hr capsule TAKE 1 CAPSULE(60 MG) BY MOUTH DAILY 30 capsule 0   selenium 200 MCG TABS tablet Take 1 tablet by mouth daily.     TESTOSTERONE TD Place onto the skin.     thyroid (ARMOUR) 90 MG tablet Take 1 tablet by mouth daily.     valACYclovir (VALTREX) 500 MG tablet Take 2 tablets by mouth as needed. For outbreak in eye     No current facility-administered medications for this visit.    Allergies  Allergen Reactions   Sulfonamide  Derivatives Rash    Review of Systems negative except from HPI and PMH  Physical Exam BP 120/86   Pulse 74   Ht 5\' 10"  (1.778 m)   Wt 183 lb 12.8 oz (83.4 kg)   LMP 05/31/2023   SpO2 98%   BMI 26.37 kg/m  Well developed and nourished in no acute distress HENT normal Neck supple  Clear Regular rate and rhythm,   No Clubbing cyanosis edema Skin-warm and dry A & Oriented  Grossly normal sensory and motor function  ECG sinus at 74 Intervals 14/09/38 R prime lead  V1 and V2 low voltage    R' new and voltage lower form 2019 but not much different from 2013   Assessment and  Plan Palpitations  Elevated D-dimer with an abnormal ECG with new right axis.   Presurgical evaluation    Palpitations are quiescient.  Continue with the Inderal.

## 2024-05-27 NOTE — Progress Notes (Deleted)
    Cardiology Office Note Date:  05/27/2024  ID:  Emily Richardson, DOB 1976-05-15, MRN [id number] PCP:  Emily Ronal Crank, PA-C  Cardiologist:   Emily VEAR Ren Donley, MD  No chief complaint on file.     Problems Palpitations Inappropriate sinus tachycardia M: PL40 as needed  Visits  12/24:    History of Present Illness: Emily Richardson is a 48 y.o. female who presents for new visit.   ROS: Please see the history of present illness. All other systems are reviewed and negative.   PHYSICAL EXAM: VS:  There were no vitals taken for this visit. , BMI There is no height or weight on file to calculate BMI. GEN: Well nourished, well developed, in no acute distress HEENT: normal Neck: no JVD, carotid bruits, or masses Cardiac: ***RRR; no murmurs, rubs, or gallops,no edema  Respiratory:  CTAB bilaterally, normal work of breathing GI: soft, nontender, nondistended, + BS Extremities: No LE edema Skin: warm and dry, no rash Neuro:  Strength and sensation are intact  EKG: ***  Recent Labs: Reviewed  Studies: Reviewed  ASSESSMENT AND PLAN: Emily Richardson is a 48 y.o. female who presents for new visit.  - *** - *** - *** - ***   Signed, Emily VEAR Ren Donley, MD  05/27/2024 1:07 PM    [facility]

## 2024-05-28 ENCOUNTER — Ambulatory Visit

## 2024-06-05 ENCOUNTER — Ambulatory Visit: Admitting: Cardiology

## 2024-06-10 ENCOUNTER — Other Ambulatory Visit: Payer: Self-pay | Admitting: Student

## 2024-06-10 MED ORDER — PROPRANOLOL HCL ER 60 MG PO CP24: 60.0000 mg | ORAL_CAPSULE | Freq: Every day | ORAL | 0 refills | Status: DC

## 2024-06-13 ENCOUNTER — Telehealth: Payer: Self-pay

## 2024-06-13 ENCOUNTER — Encounter: Payer: Self-pay | Admitting: Student

## 2024-06-13 MED ORDER — PROPRANOLOL HCL ER 60 MG PO CP24: 60.0000 mg | ORAL_CAPSULE | Freq: Every day | ORAL | 0 refills | Status: AC

## 2024-06-13 NOTE — Telephone Encounter (Signed)
 error

## 2024-06-13 NOTE — Telephone Encounter (Signed)
" °*  STAT* If patient is at the pharmacy, call can be transferred to refill team.   1. Which medications need to be refilled? (please list name of each medication and dose if known)  propranolol  ER (INDERAL  LA) 60 MG 24 hr capsule   2. Would you like to learn more about the convenience, safety, & potential cost savings by using the [facility]? no   3. Are you open to using the [facility] (Type [facility]. no  4. Which pharmacy/location (including street and city if local pharmacy) is medication to be sent to?  [facility] - [place], [place] - [address]     5. Do they need a 30 day or 90 day supply? 90days  "

## 2024-06-23 NOTE — Progress Notes (Unsigned)
"   °  °  Cardiology Office Note Date:  06/23/2024  ID:  [name], DOB Nov 13, 1975, MRN [id number] PCP:  Latisha Ronal Crank, PA-C  Cardiologist:   Joelle VEAR Ren Donley, MD  No chief complaint on file.     Problems Palpitations Inappropriate sinus tachycardia M: PL60; PL40 as needed  Visits  1/26: LP    History of Present Illness: Emily Richardson is a 49 y.o. female who presents for follow up.  ROS: Please see the history of present illness. All other systems are reviewed and negative.   PHYSICAL EXAM: VS:  There were no vitals taken for this visit. , BMI There is no height or weight on file to calculate BMI. GEN: Well nourished, well developed, in no acute distress HEENT: normal Neck: no JVD, carotid bruits, or masses Cardiac: ***RRR; no murmurs, rubs, or gallops,no edema  Respiratory:  CTAB bilaterally, normal work of breathing GI: soft, nontender, nondistended, + BS Extremities: No LE edema Skin: warm and dry, no rash Neuro:  Strength and sensation are intact  Recent Labs: Reviewed  Studies: Reviewed  ASSESSMENT AND PLAN: Emily Richardson is a 49 y.o. female who presents for new visit.  - *** - *** - *** - ***   Signed, Joelle VEAR Ren Donley, MD  06/23/2024 2:59 PM    [facility] "

## 2024-06-27 ENCOUNTER — Ambulatory Visit: Payer: Self-pay

## 2024-06-27 DIAGNOSIS — I951 Orthostatic hypotension: Secondary | ICD-10-CM

## 2024-06-27 DIAGNOSIS — I4711 Inappropriate sinus tachycardia, so stated: Secondary | ICD-10-CM
